# Patient Record
Sex: Female | Born: 1975 | Race: White | Hispanic: No | Marital: Married | Smoking: Never smoker
Health system: Southern US, Community
[De-identification: ages and names within clinical notes are randomized; demographics above are authoritative.]

## PROBLEM LIST (undated history)

## (undated) DIAGNOSIS — N979 Female infertility, unspecified: Secondary | ICD-10-CM

## (undated) DIAGNOSIS — B019 Varicella without complication: Secondary | ICD-10-CM

## (undated) DIAGNOSIS — D259 Leiomyoma of uterus, unspecified: Secondary | ICD-10-CM

## (undated) DIAGNOSIS — N200 Calculus of kidney: Secondary | ICD-10-CM

## (undated) DIAGNOSIS — E282 Polycystic ovarian syndrome: Secondary | ICD-10-CM

## (undated) DIAGNOSIS — F4323 Adjustment disorder with mixed anxiety and depressed mood: Secondary | ICD-10-CM

## (undated) DIAGNOSIS — Z87442 Personal history of urinary calculi: Secondary | ICD-10-CM

## (undated) DIAGNOSIS — N939 Abnormal uterine and vaginal bleeding, unspecified: Secondary | ICD-10-CM

## (undated) HISTORY — DX: Calculus of kidney: N20.0

## (undated) HISTORY — DX: Adjustment disorder with mixed anxiety and depressed mood: F43.23

## (undated) HISTORY — DX: Female infertility, unspecified: N97.9

## (undated) HISTORY — PX: KNEE ARTHROSCOPY W/ ACL RECONSTRUCTION: SHX1858

## (undated) HISTORY — DX: Varicella without complication: B01.9

## (undated) HISTORY — DX: Polycystic ovarian syndrome: E28.2

## (undated) HISTORY — PX: EXTRACORPOREAL SHOCK WAVE LITHOTRIPSY: SHX1557

---

## 2004-10-02 ENCOUNTER — Ambulatory Visit: Payer: Self-pay | Admitting: Family Medicine

## 2004-10-11 ENCOUNTER — Ambulatory Visit: Payer: Self-pay | Admitting: Family Medicine

## 2005-01-04 ENCOUNTER — Ambulatory Visit: Payer: Self-pay | Admitting: Family Medicine

## 2005-11-13 ENCOUNTER — Ambulatory Visit: Payer: Self-pay | Admitting: Family Medicine

## 2007-08-03 ENCOUNTER — Encounter: Payer: Self-pay | Admitting: Family Medicine

## 2007-10-28 ENCOUNTER — Ambulatory Visit (HOSPITAL_COMMUNITY): Admission: RE | Admit: 2007-10-28 | Discharge: 2007-10-28 | Payer: Self-pay | Admitting: Obstetrics and Gynecology

## 2008-04-16 ENCOUNTER — Inpatient Hospital Stay (HOSPITAL_COMMUNITY): Admission: AD | Admit: 2008-04-16 | Discharge: 2008-04-16 | Payer: Self-pay | Admitting: Obstetrics and Gynecology

## 2008-08-08 HISTORY — PX: MYOMECTOMY: SHX85

## 2008-08-08 HISTORY — PX: MYOMECTOMY ABDOMINAL APPROACH: SUR870

## 2008-12-06 ENCOUNTER — Ambulatory Visit (HOSPITAL_COMMUNITY): Admission: RE | Admit: 2008-12-06 | Discharge: 2008-12-06 | Payer: Self-pay | Admitting: Gynecology

## 2009-03-30 ENCOUNTER — Ambulatory Visit: Payer: Self-pay | Admitting: Family Medicine

## 2009-03-30 DIAGNOSIS — D179 Benign lipomatous neoplasm, unspecified: Secondary | ICD-10-CM | POA: Insufficient documentation

## 2009-03-30 DIAGNOSIS — R143 Flatulence: Secondary | ICD-10-CM

## 2009-03-30 DIAGNOSIS — R142 Eructation: Secondary | ICD-10-CM

## 2009-03-30 DIAGNOSIS — R141 Gas pain: Secondary | ICD-10-CM

## 2009-03-30 DIAGNOSIS — N390 Urinary tract infection, site not specified: Secondary | ICD-10-CM

## 2009-03-30 DIAGNOSIS — Z87442 Personal history of urinary calculi: Secondary | ICD-10-CM

## 2009-03-30 LAB — CONVERTED CEMR LAB
Bilirubin Urine: NEGATIVE
Blood in Urine, dipstick: NEGATIVE
Ketones, urine, test strip: NEGATIVE
Protein, U semiquant: NEGATIVE
Urobilinogen, UA: 0.2

## 2009-04-03 LAB — CONVERTED CEMR LAB
AST: 18 units/L (ref 0–37)
Albumin: 4.4 g/dL (ref 3.5–5.2)
Basophils Absolute: 0 10*3/uL (ref 0.0–0.1)
Basophils Relative: 0.3 % (ref 0.0–3.0)
CO2: 28 meq/L (ref 19–32)
GFR calc non Af Amer: 87.24 mL/min (ref >60)
Glucose, Bld: 87 mg/dL (ref 70–99)
HCT: 38.4 % (ref 36.0–46.0)
HDL: 87.7 mg/dL (ref >39.00)
Hemoglobin: 13.3 g/dL (ref 12.0–15.0)
Lymphs Abs: 2 10*3/uL (ref 0.7–4.0)
MCHC: 34.6 g/dL (ref 30.0–36.0)
Monocytes Relative: 8.1 % (ref 3.0–12.0)
Neutro Abs: 1.7 10*3/uL (ref 1.4–7.7)
Potassium: 4.8 meq/L (ref 3.5–5.1)
RBC: 3.94 M/uL (ref 3.87–5.11)
RDW: 11.9 % (ref 11.5–14.6)
Sodium: 144 meq/L (ref 135–145)
TSH: 1.06 microintl units/mL (ref 0.35–5.50)
Total CHOL/HDL Ratio: 2
Total Protein: 6.4 g/dL (ref 6.0–8.3)

## 2010-01-11 ENCOUNTER — Inpatient Hospital Stay (HOSPITAL_COMMUNITY): Admission: AD | Admit: 2010-01-11 | Discharge: 2010-01-11 | Payer: Self-pay | Admitting: Obstetrics and Gynecology

## 2010-01-11 ENCOUNTER — Ambulatory Visit: Payer: Self-pay | Admitting: Obstetrics and Gynecology

## 2010-04-10 ENCOUNTER — Inpatient Hospital Stay (HOSPITAL_COMMUNITY): Admission: AD | Admit: 2010-04-10 | Discharge: 2010-04-10 | Payer: Self-pay | Admitting: Obstetrics and Gynecology

## 2010-04-10 DIAGNOSIS — O4700 False labor before 37 completed weeks of gestation, unspecified trimester: Secondary | ICD-10-CM

## 2010-04-10 DIAGNOSIS — O368390 Maternal care for abnormalities of the fetal heart rate or rhythm, unspecified trimester, not applicable or unspecified: Secondary | ICD-10-CM

## 2010-04-13 ENCOUNTER — Other Ambulatory Visit: Payer: Self-pay | Admitting: Obstetrics and Gynecology

## 2010-04-16 ENCOUNTER — Inpatient Hospital Stay (HOSPITAL_COMMUNITY): Admission: AD | Admit: 2010-04-16 | Discharge: 2010-04-20 | Payer: Self-pay | Admitting: Obstetrics and Gynecology

## 2010-04-16 ENCOUNTER — Encounter (INDEPENDENT_AMBULATORY_CARE_PROVIDER_SITE_OTHER): Payer: Self-pay | Admitting: Obstetrics and Gynecology

## 2010-05-24 ENCOUNTER — Ambulatory Visit
Admission: RE | Admit: 2010-05-24 | Discharge: 2010-05-24 | Payer: Self-pay | Source: Home / Self Care | Attending: Obstetrics and Gynecology | Admitting: Obstetrics and Gynecology

## 2010-05-30 ENCOUNTER — Ambulatory Visit
Admission: RE | Admit: 2010-05-30 | Discharge: 2010-05-30 | Payer: Self-pay | Source: Home / Self Care | Attending: Obstetrics and Gynecology | Admitting: Obstetrics and Gynecology

## 2010-08-21 LAB — SURGICAL PCR SCREEN
MRSA, PCR: NEGATIVE
Staphylococcus aureus: NEGATIVE

## 2010-08-21 LAB — RH IMMUNE GLOB WKUP(>/=20WKS)(NOT WOMEN'S HOSP): Unit division: 0

## 2010-08-21 LAB — CBC
HCT: 37.2 % (ref 36.0–46.0)
Hemoglobin: 10.6 g/dL — ABNORMAL LOW (ref 12.0–15.0)
Hemoglobin: 12.9 g/dL (ref 12.0–15.0)
Hemoglobin: 13.3 g/dL (ref 12.0–15.0)
MCH: 33.9 pg (ref 26.0–34.0)
MCH: 34.5 pg — ABNORMAL HIGH (ref 26.0–34.0)
MCHC: 34.7 g/dL (ref 30.0–36.0)
MCV: 99.6 fL (ref 78.0–100.0)
Platelets: 166 10*3/uL (ref 150–400)
Platelets: 217 10*3/uL (ref 150–400)
RBC: 3.08 MIL/uL — ABNORMAL LOW (ref 3.87–5.11)
RBC: 3.78 MIL/uL — ABNORMAL LOW (ref 3.87–5.11)
RBC: 3.93 MIL/uL (ref 3.87–5.11)
WBC: 13.1 10*3/uL — ABNORMAL HIGH (ref 4.0–10.5)
WBC: 15 10*3/uL — ABNORMAL HIGH (ref 4.0–10.5)

## 2010-08-21 LAB — TYPE AND SCREEN
ABO/RH(D): A NEG
Antibody Screen: POSITIVE
Weak D: NEGATIVE

## 2010-08-21 LAB — RPR
RPR Ser Ql: NONREACTIVE
RPR Ser Ql: NONREACTIVE

## 2010-08-24 LAB — URINALYSIS, ROUTINE W REFLEX MICROSCOPIC
Glucose, UA: NEGATIVE mg/dL
Ketones, ur: NEGATIVE mg/dL
Nitrite: NEGATIVE
Protein, ur: NEGATIVE mg/dL

## 2011-04-18 ENCOUNTER — Ambulatory Visit: Payer: Self-pay | Admitting: Family Medicine

## 2011-04-23 ENCOUNTER — Encounter: Payer: Self-pay | Admitting: Family Medicine

## 2011-04-23 ENCOUNTER — Ambulatory Visit (INDEPENDENT_AMBULATORY_CARE_PROVIDER_SITE_OTHER): Payer: Self-pay | Admitting: Family Medicine

## 2011-04-23 DIAGNOSIS — N390 Urinary tract infection, site not specified: Secondary | ICD-10-CM

## 2011-04-29 ENCOUNTER — Ambulatory Visit: Payer: Self-pay | Admitting: Family Medicine

## 2011-05-16 NOTE — Progress Notes (Signed)
  Subjective:    Patient ID: Christina Olson, female    DOB: [birth year], 35 y.o.   MRN: [id number]  HPI    Review of Systems     Objective:   Physical Exam        Assessment & Plan:  She cancelled this appt

## 2011-12-09 ENCOUNTER — Other Ambulatory Visit: Payer: Self-pay | Admitting: Urology

## 2011-12-11 ENCOUNTER — Encounter (HOSPITAL_COMMUNITY): Payer: Self-pay | Admitting: *Deleted

## 2011-12-11 NOTE — Progress Notes (Signed)
Patient instructed no Aspirin products 3 days prior to ESWL 

## 2011-12-16 ENCOUNTER — Encounter (HOSPITAL_COMMUNITY): Payer: Self-pay | Admitting: Pharmacy Technician

## 2011-12-16 ENCOUNTER — Ambulatory Visit (HOSPITAL_COMMUNITY): Payer: Self-pay

## 2011-12-16 ENCOUNTER — Encounter (HOSPITAL_COMMUNITY)
Admission: RE | Disposition: A | Discharge status: Home / Self Care | Payer: Self-pay | Source: Ambulatory Visit | Attending: Urology

## 2011-12-16 ENCOUNTER — Encounter (HOSPITAL_COMMUNITY): Payer: Self-pay | Admitting: *Deleted

## 2011-12-16 ENCOUNTER — Ambulatory Visit (HOSPITAL_COMMUNITY)
Admission: RE | Admit: 2011-12-16 | Discharge: 2011-12-16 | Disposition: A | Discharge status: Home / Self Care | Payer: Self-pay | Source: Ambulatory Visit | Attending: Urology | Admitting: Urology

## 2011-12-16 DIAGNOSIS — N201 Calculus of ureter: Secondary | ICD-10-CM | POA: Insufficient documentation

## 2011-12-16 HISTORY — PX: LITHOTRIPSY: SUR834

## 2011-12-16 SURGERY — LITHOTRIPSY, ESWL
Anesthesia: LOCAL | Laterality: Right

## 2011-12-16 MED ORDER — CIPROFLOXACIN HCL 500 MG PO TABS
500.0000 mg | ORAL_TABLET | ORAL | Status: AC
  Administered 2011-12-16: 500 mg via ORAL

## 2011-12-16 MED ORDER — DIPHENHYDRAMINE HCL 25 MG PO CAPS
25.0000 mg | ORAL_CAPSULE | ORAL | Status: AC
  Administered 2011-12-16: 25 mg via ORAL

## 2011-12-16 MED ORDER — DIPHENHYDRAMINE HCL 25 MG PO CAPS
ORAL_CAPSULE | ORAL | Status: AC
  Filled 2011-12-16: qty 1

## 2011-12-16 MED ORDER — DEXTROSE-NACL 5-0.45 % IV SOLN
INTRAVENOUS | Status: DC
  Administered 2011-12-16: 1000 mL via INTRAVENOUS

## 2011-12-16 MED ORDER — DIAZEPAM 5 MG PO TABS
10.0000 mg | ORAL_TABLET | ORAL | Status: AC
  Administered 2011-12-16: 10 mg via ORAL

## 2011-12-16 MED ORDER — CIPROFLOXACIN HCL 500 MG PO TABS
ORAL_TABLET | ORAL | Status: AC
  Filled 2011-12-16: qty 1

## 2011-12-16 MED ORDER — DIAZEPAM 5 MG PO TABS
ORAL_TABLET | ORAL | Status: AC
  Filled 2011-12-16: qty 2

## 2011-12-16 NOTE — H&P (Signed)
History of Present Illness   [name] came in today for a KUB. I compared it to her CT scan and in my opinion the stone has dropped from the level to the iliac to the distal right ureter. I reviewed the X-rays with Dr. Annabell Howells and he agreed with me. The stone is approximately 7 mm in size and somewhat irregular.   She has been pain free.   She has mild stable frequency.   Her abdominal pain has settled down.   Review of systems: No change in bowel or neurologic status.   Urinalysis: Positive red blood cells. Rare bacteria. Urine was sent for culture.    Surgical History Problems  1. History of  Cesarean Section 2. History of  Uterine Surgery  Current Meds 1. Hydrocodone-Acetaminophen 5-325 MG Oral Tablet; TAKE 1 TABLET EVERY 4 TO 6 HOURS AS  NEEDED; Therapy: 20Jun2013 to (Evaluate:27Jun2013); Last Rx:20Jun2013 2. Mirena IUD; Therapy: (Recorded:19Jun2013) to 3. Tretinoin 0.05 % External Cream; Therapy: 11Sep2012 to  Allergies Medication  1. No Known Drug Allergies  Family History Problems  1. Family history of  Death In The Family Father dead at age 8 of heart attack 2. Family history of  Family Health Status Number Of Children twin boys  Social History Problems  1. Alcohol Use rare wine 2. Caffeine Use 1 cup of coffee daily 3. Marital History - Currently Married 4. Never A Smoker 5. Occupation: Stay at home Mom  Results/Data  Urine Tempie Donning Includes: Last 1 Day]   01Jul2013  COLOR YELLOW   APPEARANCE CLEAR   SPECIFIC GRAVITY 1.020   pH 6.5   GLUCOSE NEG mg/dL  BILIRUBIN NEG   KETONE NEG mg/dL  BLOOD MOD   PROTEIN TRACE mg/dL  UROBILINOGEN 0.2 mg/dL  NITRITE NEG   LEUKOCYTE ESTERASE NEG   SQUAMOUS EPITHELIAL/HPF NONE SEEN   WBC 3-6 WBC/hpf  RBC 7-10 RBC/hpf  BACTERIA RARE   CRYSTALS NONE SEEN   CASTS NONE SEEN    Assessment Assessed  1. Abdominal Pain 789.00 2. Urinary Calculus Bilaterally 592.9  Plan   Discussion/Summary   I talked to Ms  Upham about passing the stone. She was given a strainer and will push fluids. She was given two weeks of Rapaflo samples. She has plenty of pain medicine. I gave her a prescription of Phenergan 20 tablets as needed.  I talked to her about lithotripsy.  We talked about ESWL in detail. Pros, cons, general surgical and anesthetic risks, and other options including watchful waiting and ureteroscopy were discussed. Success and failure rates and need for further/repeat therapy were discussed. Risks were described but not limited to pain, infection, sepsis, and bleeding. The risk of renal and ureteral trauma with short and long term sequelae was discussed. The risk of injury to adjacent structures was discussed. The risk of needing a stent post-ESWL was discussed. Theoretical issues involving ovary were discussed.  I talked to her about ureteroscopy including success, failure rates and risks. Sequelae requiring reimplantation or percutaneous tube were also discussed.  I agree with our plan that she would like to have lithotripsy next Monday if she does not pass the stone. She will go to the [facility] Emergency Room or call if her pain is difficult to control or she develops a fever.   Ms. Dunsmore is obviously a stone former. She will need a metabolic workup moving forward.   After a thorough review of the management options for the patient's condition the patient  elected to  proceed with surgical therapy as noted above. We have discussed the potential benefits and risks of the procedure, side effects of the proposed treatment, the likelihood of the patient achieving the goals of the procedure, and any potential problems that might occur during the procedure or recuperation. Informed consent has been obtained.

## 2012-02-11 ENCOUNTER — Emergency Department (HOSPITAL_COMMUNITY)
Admission: EM | Admit: 2012-02-11 | Discharge: 2012-02-12 | Disposition: A | Discharge status: Home / Self Care | Payer: 59 | Attending: Emergency Medicine | Admitting: Emergency Medicine

## 2012-02-11 ENCOUNTER — Emergency Department (HOSPITAL_COMMUNITY): Payer: 59

## 2012-02-11 ENCOUNTER — Encounter (HOSPITAL_COMMUNITY): Payer: Self-pay | Admitting: *Deleted

## 2012-02-11 DIAGNOSIS — M654 Radial styloid tenosynovitis [de Quervain]: Secondary | ICD-10-CM

## 2012-02-11 DIAGNOSIS — M65839 Other synovitis and tenosynovitis, unspecified forearm: Secondary | ICD-10-CM | POA: Insufficient documentation

## 2012-02-11 NOTE — ED Notes (Signed)
Patient with wrist pain for about two months and today she felt a sharp pain on it and she fell onto the left wrist

## 2012-02-12 MED ORDER — HYDROCODONE-ACETAMINOPHEN 5-325 MG PO TABS: 1.0000 | ORAL_TABLET | Freq: Four times a day (QID) | ORAL | Status: AC | PRN

## 2012-02-12 MED ORDER — NAPROXEN 500 MG PO TABS: 500.0000 mg | ORAL_TABLET | Freq: Two times a day (BID) | ORAL | Status: AC

## 2012-02-12 NOTE — ED Provider Notes (Signed)
Medical screening examination/treatment/procedure(s) were performed by non-physician practitioner and as supervising physician I was immediately available for consultation/collaboration.  Royale Swamy, MD 02/12/12 2211 

## 2012-02-12 NOTE — ED Provider Notes (Signed)
History     CSN: [id number]  Arrival date & time 02/11/12  2120   First MD Initiated Contact with Patient 02/12/12 0036      Chief Complaint  Patient presents with  . Wrist Pain    (Consider location/radiation/quality/duration/timing/severity/associated sxs/prior treatment) HPI Comments: Patient is a 36 year old healthy female that presents emergency department with chief complaint of left wrist pain.  Patient states that pain began about 2 months ago, but has been occasional worsened by certain thumb or wrist positions.  Symptoms have been tolerable until earlier today when she fell holding her baby landing on her wrist.  Patient denies any decreased range of motion, numbness, or tingling of extremity.  Note the patient is a mother of 2 and is constantly holding both kids and picking them up.  No other complaints this time.  Patient is a 37 y.o. female presenting with wrist pain. The history is provided by the patient.  Wrist Pain Pertinent negatives include no abdominal pain, chest pain, chills, congestion, fever, headaches, numbness or weakness.    Past Medical History  Diagnosis Date  . Chicken pox   . Infertility, female     has seen Dr Chevis Pretty and Dr. Billy Coast, now sees Dr.Richard Clarkson in [place]  . Polycystic ovaries   . Situational mixed anxiety and depressive disorder     anxiety about flying  . Nephrolithiasis     hx of twice in 1998, none since    Past Surgical History  Procedure Date  . Myomectomy 3/10    7 fibroids    Family History  Problem Relation Age of Onset  . Heart attack Father 27  . Hypertension Other     History  Substance Use Topics  . Smoking status: Never Smoker   . Smokeless tobacco: Not on file  . Alcohol Use: Yes     rarely    OB History    Grav Para Term Preterm Abortions TAB SAB Ect Mult Living                  Review of Systems  Constitutional: Negative for fever, chills and appetite change.  HENT: Negative for congestion.    Eyes: Negative for visual disturbance.  Respiratory: Negative for shortness of breath.   Cardiovascular: Negative for chest pain and leg swelling.  Gastrointestinal: Negative for abdominal pain.  Genitourinary: Negative for dysuria, urgency and frequency.  Neurological: Negative for dizziness, syncope, weakness, light-headedness, numbness and headaches.  Psychiatric/Behavioral: Negative for confusion.  All other systems reviewed and are negative.    Allergies  Review of patient's allergies indicates no known allergies.  Home Medications   Current Outpatient Rx  Name Route Sig Dispense Refill  . LEVONORGESTREL 20 MCG/24HR IU IUD Intrauterine 1 each by Intrauterine route once.    . TRETINOIN 0.05 % EX CREA Topical Apply 1 application topically at bedtime. Apply to face      BP 122/76  Pulse 59  Temp 99.2 F (37.3 C) (Oral)  Resp 18  SpO2 100%  Physical Exam  Nursing note and vitals reviewed. Constitutional: She appears well-developed and well-nourished. No distress.  HENT:  Head: Normocephalic and atraumatic.  Eyes: Conjunctivae and EOM are normal.  Neck: Normal range of motion. Neck supple.  Cardiovascular:       Intact distal pulses, capillary refill < 3 seconds  Musculoskeletal:       Tenderness over palpation over the extensor pollicis breves tendon. Pain with thumb ROM. NO pain with wrist ROM  and no bony ttp including over the scaphoid. Finkelstein's test positive. All other extremities with normal ROM  Neurological:       No sensory deficit  Skin: She is not diaphoretic.       Skin intact, no tenting    ED Course  Procedures (including critical care time)  Labs Reviewed - No data to display Dg Wrist Complete Left  02/11/2012  *RADIOLOGY REPORT*  Clinical Data: Pain post trauma  LEFT WRIST - COMPLETE 3+ VIEW  Comparison: None.  Findings: Frontal, lateral, oblique, and ulnar deviation scaphoid images were obtained.  No fracture or dislocation.  Joint spaces  appear intact.  No erosive change.  IMPRESSION:  No fracture or dislocation.   Original Report Authenticated By: Arvin Collard. WOODRUFF III, M.D.      No diagnosis found.    MDM  Thumb tendonitis  Pt is a 23 yo healthy mother presents with thumb tendonitis. Will dc w NSAIDs home care instructions & Hand follow up id s/s persist         Jaci Carrel, PA-C 02/12/12 0121  Jaci Carrel, PA-C 02/12/12 0131

## 2012-02-12 NOTE — ED Notes (Signed)
Pt denies any questions upon discharge, verbalized understanding of no driving to pain meds and f/u instructions. Pt has splint applied to left thumb/wrist upon discharge.

## 2012-05-27 ENCOUNTER — Other Ambulatory Visit: Payer: Self-pay | Admitting: Dermatology

## 2012-10-26 ENCOUNTER — Telehealth: Payer: Self-pay | Admitting: Family Medicine

## 2012-10-26 NOTE — Telephone Encounter (Signed)
Patient Information:  Caller Name: [name]  Phone: [phone]  Patient: Christina Olson, Christina Olson  Gender: Female  DOB: 1976/01/06  Age: 37 Years  PCP: Gershon Crane [facility])  Pregnant: No  Office Follow Up:  Does the office need to follow up with this patient?: No  Instructions For The Office: N/A   Symptoms  Reason For Call & Symptoms: Pt states she had a tick bite and the bite site has not healed.  Reviewed Health History In EMR: Yes  Reviewed Medications In EMR: Yes  Reviewed Allergies In EMR: Yes  Reviewed Surgeries / Procedures: Yes  Date of Onset of Symptoms: 09/18/2012 OB / GYN:  LMP: 10/12/2012  Guideline(s) Used:  Tick Bite  Disposition Per Guideline:   Home Care  Reason For Disposition Reached:   Tick bite with no complications  Advice Given:  Tiny Deer Tick Removal:  Deer ticks are very small and need to be scraped off with a credit card edge or the edge of a knife blade.  Place tick in a sealed container (e.g., glass jar, Ziploc plastic bag), in case your doctor wants to see it.  Antibiotic Ointment:  Wash the wound and your hands with soap and water after removal to prevent catching any tick disease. Apply an over-the-counter antibiotic ointment (e.g., bacitracin) to the bite once.  Expected Course:  Tick bites normally do not itch or hurt. That is why they often go unnoticed.  Call Back If:  Bite begins to look infected  You become worse.  Patient Will Follow Care Advice:  YES

## 2013-08-18 IMAGING — CR DG WRIST COMPLETE 3+V*L*
4 series · 4 of 4 positions shown · non-contrast
Comparison: None.

CLINICAL DATA: Pain post trauma

LEFT WRIST - COMPLETE 3+ VIEW

[x wrist pa left]
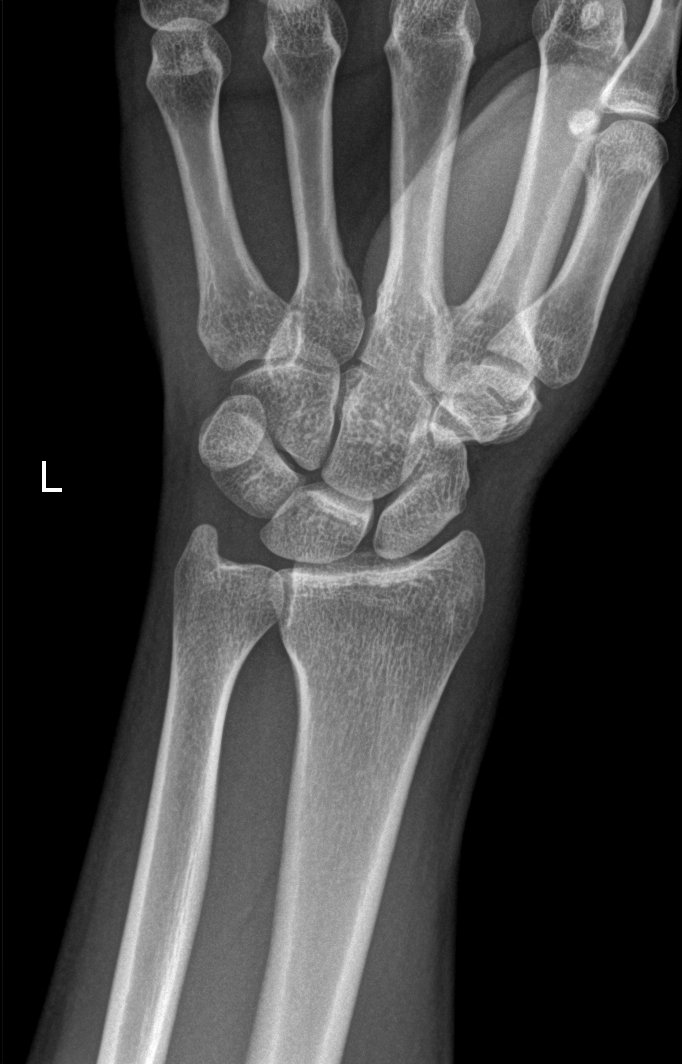

[x wrist obl left]
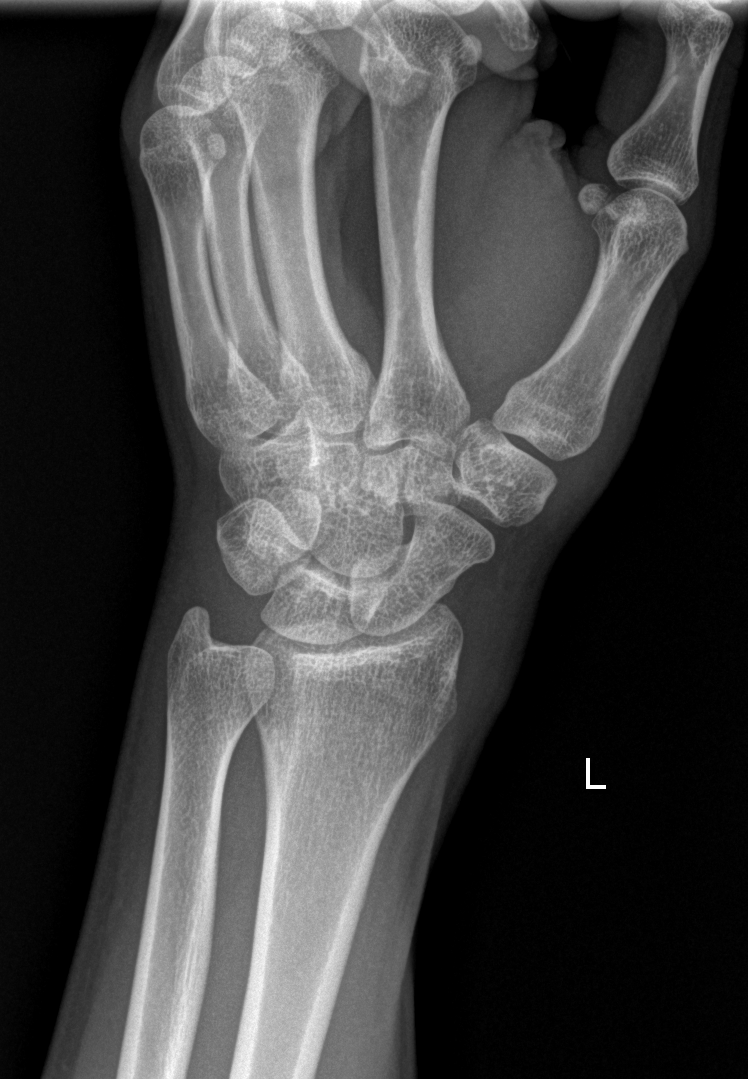

[x wrist lat left]
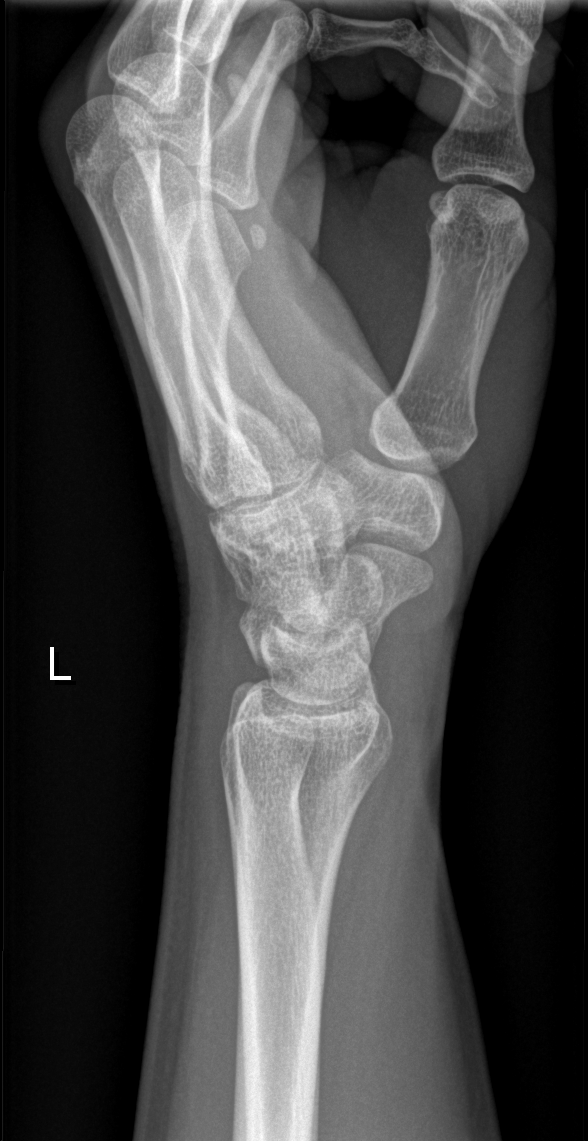

[x wrist navicular view left]
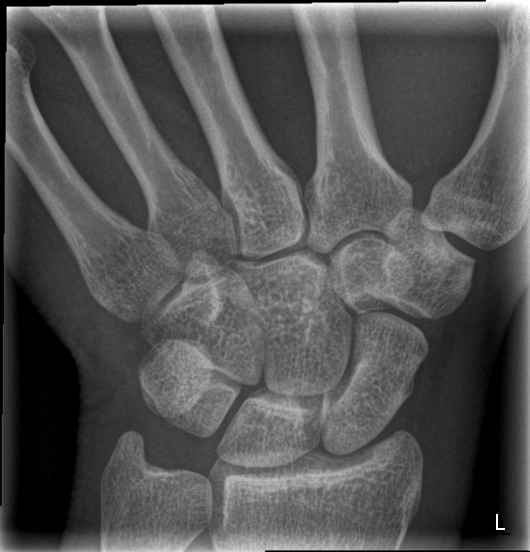

[4 of 4 positions shown; findings below may reference images not displayed]

FINDINGS: Frontal, lateral, oblique, and ulnar deviation scaphoid
images were obtained.  No fracture or dislocation.  Joint spaces
appear intact.  No erosive change.
IMPRESSION: No fracture or dislocation.

## 2013-11-12 ENCOUNTER — Encounter: Payer: Self-pay | Admitting: Family Medicine

## 2013-11-12 ENCOUNTER — Ambulatory Visit (INDEPENDENT_AMBULATORY_CARE_PROVIDER_SITE_OTHER): Payer: Commercial Managed Care - PPO | Admitting: Family Medicine

## 2013-11-12 VITALS — BP 133/77 | HR 45 | Temp 99.4°F | Ht 63.0 in | Wt 124.0 lb

## 2013-11-12 DIAGNOSIS — D171 Benign lipomatous neoplasm of skin and subcutaneous tissue of trunk: Secondary | ICD-10-CM

## 2013-11-12 DIAGNOSIS — D1779 Benign lipomatous neoplasm of other sites: Secondary | ICD-10-CM

## 2013-11-12 DIAGNOSIS — R599 Enlarged lymph nodes, unspecified: Secondary | ICD-10-CM

## 2013-11-12 DIAGNOSIS — H6120 Impacted cerumen, unspecified ear: Secondary | ICD-10-CM

## 2013-11-12 DIAGNOSIS — R590 Localized enlarged lymph nodes: Secondary | ICD-10-CM

## 2013-11-12 MED ORDER — ALPRAZOLAM 0.5 MG PO TABS: 0.5000 mg | ORAL_TABLET | Freq: Three times a day (TID) | ORAL | Status: DC | PRN

## 2013-11-12 NOTE — Progress Notes (Signed)
Pre visit review using our clinic review tool, if applicable. No additional management support is needed unless otherwise documented below in the visit note. 

## 2013-11-12 NOTE — Progress Notes (Signed)
   Subjective:    Patient ID: Christina Olson, female    DOB: 14-Oct-1975, 38 y.o.   MRN: [id number]  HPI Here for several things. First she asks me to check a lump over the right shoulder blade that has been present for years and does not bother her. Also she noticed a small lump in the right groin about 2 months ago. It does not bother her and does not seem to be changing. Last she says she has had decreased hearing in the right ear for 2 months. No pain or sinus pressure.    Review of Systems  Constitutional: Negative.   HENT: Positive for hearing loss. Negative for congestion, ear discharge, ear pain, postnasal drip and sinus pressure.   Hematological: Positive for adenopathy.       Objective:   Physical Exam  Constitutional: She appears well-developed and well-nourished. No distress.  HENT:  Left Ear: External ear normal.  Nose: Nose normal.  Mouth/Throat: Oropharynx is clear and moist.  The right ear canal is impacted with cerumen  Eyes: Conjunctivae are normal.  Neck: No thyromegaly present.  Abdominal:  There is a small (0.5 cm) firm mobile non-tender lump along the right inguinal ligament  Lymphadenopathy:    She has no cervical adenopathy.  Skin:  There is a 3 cm firm mobile non-tender mass over the right scapula          Assessment & Plan:  She has a lipoma on the back and a tiny lymph node in the right groin. These are benign and no treatment is required. We are unable to clear the cerumen impaction today so we will refer her to ENT.

## 2014-06-27 ENCOUNTER — Emergency Department (HOSPITAL_COMMUNITY)
Admission: EM | Admit: 2014-06-27 | Discharge: 2014-06-27 | Disposition: A | Discharge status: Home / Self Care | Payer: Commercial Managed Care - PPO | Attending: Emergency Medicine | Admitting: Emergency Medicine

## 2014-06-27 ENCOUNTER — Emergency Department (HOSPITAL_COMMUNITY): Payer: Commercial Managed Care - PPO

## 2014-06-27 ENCOUNTER — Encounter (HOSPITAL_COMMUNITY): Payer: Self-pay | Admitting: *Deleted

## 2014-06-27 DIAGNOSIS — F4323 Adjustment disorder with mixed anxiety and depressed mood: Secondary | ICD-10-CM | POA: Insufficient documentation

## 2014-06-27 DIAGNOSIS — Y9289 Other specified places as the place of occurrence of the external cause: Secondary | ICD-10-CM | POA: Insufficient documentation

## 2014-06-27 DIAGNOSIS — Z79899 Other long term (current) drug therapy: Secondary | ICD-10-CM | POA: Insufficient documentation

## 2014-06-27 DIAGNOSIS — W1839XA Other fall on same level, initial encounter: Secondary | ICD-10-CM | POA: Diagnosis not present

## 2014-06-27 DIAGNOSIS — Y998 Other external cause status: Secondary | ICD-10-CM | POA: Insufficient documentation

## 2014-06-27 DIAGNOSIS — S8992XA Unspecified injury of left lower leg, initial encounter: Secondary | ICD-10-CM | POA: Diagnosis not present

## 2014-06-27 DIAGNOSIS — W19XXXA Unspecified fall, initial encounter: Secondary | ICD-10-CM

## 2014-06-27 DIAGNOSIS — Z8619 Personal history of other infectious and parasitic diseases: Secondary | ICD-10-CM | POA: Diagnosis not present

## 2014-06-27 DIAGNOSIS — Y9344 Activity, trampolining: Secondary | ICD-10-CM | POA: Diagnosis not present

## 2014-06-27 DIAGNOSIS — Z87442 Personal history of urinary calculi: Secondary | ICD-10-CM | POA: Insufficient documentation

## 2014-06-27 MED ORDER — CYCLOBENZAPRINE HCL 10 MG PO TABS: 10.0000 mg | ORAL_TABLET | Freq: Two times a day (BID) | ORAL | Status: DC | PRN

## 2014-06-27 MED ORDER — HYDROCODONE-ACETAMINOPHEN 5-325 MG PO TABS
2.0000 | ORAL_TABLET | Freq: Once | ORAL | Status: AC
  Administered 2014-06-27: 2 via ORAL
  Filled 2014-06-27: qty 2

## 2014-06-27 MED ORDER — CYCLOBENZAPRINE HCL 10 MG PO TABS
10.0000 mg | ORAL_TABLET | Freq: Once | ORAL | Status: DC
  Filled 2014-06-27: qty 1

## 2014-06-27 MED ORDER — HYDROCODONE-ACETAMINOPHEN 5-325 MG PO TABS: 1.0000 | ORAL_TABLET | ORAL | Status: DC | PRN

## 2014-06-27 NOTE — ED Notes (Signed)
Pt states that she was at the trampoline park and a ball rolled under her and she fell. Pt reports left knee pain. Pt states that she has had injury to that knee in the past. Pulses present . No deformity noted.

## 2014-06-27 NOTE — Discharge Instructions (Signed)
Take Vicodin as needed for pain. Take Flexeril as needed for spasm. Follow up with your Orthopedic doctor for further evaluation. Rest, ice, and elevate.

## 2014-06-27 NOTE — ED Provider Notes (Signed)
CSN: [id number]     Arrival date & time 06/27/14  1203 History   First MD Initiated Contact with Patient 06/27/14 1314     Chief Complaint  Patient presents with  . Knee Pain     (Consider location/radiation/quality/duration/timing/severity/associated sxs/prior Treatment) HPI Comments: Patient is a 39 year old female who presents with left knee pain that started prior to arrival when she was jumping on a trampoline. Patient reports a ball rolled under her feet while she was jumping and she landed on it, causing her to fall. Patient reports immediate onset of throbbing and severe pain without radiation. Movement and weight bearing activity makes the pain worse. No alleviating factors. No other injury. No alleviating factors.    Past Medical History  Diagnosis Date  . Chicken pox   . Infertility, female     has seen Dr Carren Rang and Dr. Ronita Hipps, now sees Dr.Richard Mead[place] of [place] in [place]  . Polycystic ovaries   . Situational mixed anxiety and depressive disorder     anxiety about flying  . Nephrolithiasis     sees Dr. Bjorn Loser    Past Surgical History  Procedure Laterality Date  . Myomectomy  3/10    7 fibroids  . Lithotripsy  12-16-11    per Dr. Matilde Sprang    Family History  Problem Relation Age of Onset  . Heart attack Father 68  . Hypertension Other    History  Substance Use Topics  . Smoking status: Never Smoker   . Smokeless tobacco: Never Used  . Alcohol Use: Yes     Comment: occ   OB History    No data available     Review of Systems  Constitutional: Negative for fever, chills and fatigue.  HENT: Negative for trouble swallowing.   Eyes: Negative for visual disturbance.  Respiratory: Negative for shortness of breath.   Cardiovascular: Negative for chest pain and palpitations.  Gastrointestinal: Negative for nausea, vomiting, abdominal pain and diarrhea.  Genitourinary: Negative for dysuria and difficulty urinating.  Musculoskeletal: Positive for arthralgias.  Negative for neck pain.  Skin: Negative for color change.  Neurological: Negative for dizziness and weakness.  Psychiatric/Behavioral: Negative for dysphoric mood.      Allergies  Review of patient's allergies indicates no known allergies.  Home Medications   Prior to Admission medications   Medication Sig Start Date End Date Taking? Authorizing Provider  ALPRAZolam Duanne Moron) 0.5 MG tablet Take 1 tablet (0.5 mg total) by mouth 3 (three) times daily as needed for anxiety. 11/12/13   Laurey Morale, MD  levonorgestrel (MIRENA) 20 MCG/24HR IUD 1 each by Intrauterine route once.    Historical Provider, MD  tretinoin (RETIN-A) 0.05 % cream Apply 1 application topically at bedtime. Apply to face    Historical Provider, MD   There were no vitals taken for this visit. Physical Exam  Constitutional: She is oriented to person, place, and time. She appears well-developed and well-nourished. No distress.  HENT:  Head: Normocephalic and atraumatic.  Eyes: Conjunctivae and EOM are normal.  Neck: Normal range of motion.  Cardiovascular: Normal rate and regular rhythm.  Exam reveals no gallop and no friction rub.   No murmur heard. Pulmonary/Chest: Effort normal and breath sounds normal. She has no wheezes. She has no rales. She exhibits no tenderness.  Abdominal: Soft. She exhibits no distension. There is no tenderness. There is no rebound.  Musculoskeletal:  Limited ROM of left knee. Anterior left knee tenderness to palpation over patella tendon. No  obvious deformity. Mild generalized edema.   Neurological: She is alert and oriented to person, place, and time. Coordination normal.  Speech is goal-oriented. Moves limbs without ataxia.   Skin: Skin is warm and dry.  Psychiatric: She has a normal mood and affect. Her behavior is normal.  Nursing note and vitals reviewed.   ED Course  Procedures (including critical care time) Labs Review Labs Reviewed - No data to display  Imaging Review Dg  Knee Complete 4 Views Left  06/27/2014   CLINICAL DATA:  Patient fell while jumping on trampoline. Pain and swelling anteriorly.  EXAM: LEFT KNEE - COMPLETE 4+ VIEW  COMPARISON:  Left knee MRI April 15, 2009  FINDINGS: Frontal, lateral, and bilateral oblique views were obtained. There is no fracture or dislocation. No effusion. Joint spaces appear intact. No erosive change.  IMPRESSION: No fracture or dislocation.  No appreciable arthropathy.   Electronically Signed   By: Lowella Grip M.D.   On: 06/27/2014 16:60   SPLINT APPLICATION Date/Time: 6:00 PM Authorized by: Alvina Chou Consent: Verbal consent obtained. Risks and benefits: risks, benefits and alternatives were discussed Consent given by: patient Splint applied by: orthopedic technician Location details: left knee Splint type: knee immobilizer Supplies used: knee immobilizer Post-procedure: The splinted body part was neurovascularly unchanged following the procedure. Patient tolerance: Patient tolerated the procedure well with no immediate complications.      EKG Interpretation None      MDM   Final diagnoses:  Left knee injury, initial encounter    1:44 PM Xray unremarkable for acute changes. No other injury. Patient has no fracture but ligament or tendon injury cannot be excluded. Patient will have knee immobilizer and crutches. Patient will follow up with Orthopedic surgery. No neurovascular compromise.     Alvina Chou, PA-C 06/30/14 1551  Johnna Acosta, MD 07/01/14 [phone]

## 2015-06-07 ENCOUNTER — Ambulatory Visit (INDEPENDENT_AMBULATORY_CARE_PROVIDER_SITE_OTHER): Payer: Commercial Managed Care - PPO | Admitting: Family Medicine

## 2015-06-07 ENCOUNTER — Encounter: Payer: Self-pay | Admitting: Family Medicine

## 2015-06-07 VITALS — BP 126/84 | HR 49 | Ht 63.0 in | Wt 122.0 lb

## 2015-06-07 DIAGNOSIS — S8992XA Unspecified injury of left lower leg, initial encounter: Secondary | ICD-10-CM | POA: Diagnosis not present

## 2015-06-08 DIAGNOSIS — S8992XA Unspecified injury of left lower leg, initial encounter: Secondary | ICD-10-CM | POA: Insufficient documentation

## 2015-06-08 NOTE — Progress Notes (Signed)
PCP: Laurey Morale, MD  Subjective:   HPI: Patient is a 39 y.o. female here for left knee injury.  Patient reports back in January she was jumping on a trampoline when a ball rolled under her feet causing her to fall and injure her left knee somehow. Immediate pain and throbbing in this knee, difficulty bearing weight. She was found to have patellar contusion and small fracture, ACL tear. Had initial period of rest then ACL reconstruction, medial meniscus repair followed by rehabilitation. Her rehab stalled at about 105 to 110 degrees flexion - had manipulation under anesthesia and cortisone injection and did well for a short period. Started to get pain, catching and stiffness within the left knee though. Went back to ortho - had MRI this month showing about 50% of ACL was intact, other part was potentially causing catching of the knee.  Also with a complex medial meniscus tear. She does not recall another injury this year. Plan is to have surgery again early next year - do a pivot test under anesthesia to assess ACL integrity. She is here for second opinion.  Past Medical History  Diagnosis Date  . Chicken pox   . Infertility, female     has seen Dr Carren Rang and Dr. Ronita Hipps, now sees Dr.Richard [place] in [place]  . Polycystic ovaries   . Situational mixed anxiety and depressive disorder     anxiety about flying  . Nephrolithiasis     sees Dr. Bjorn Loser     Current Outpatient Prescriptions on File Prior to Visit  Medication Sig Dispense Refill  . ALPRAZolam (XANAX) 0.5 MG tablet Take 1 tablet (0.5 mg total) by mouth 3 (three) times daily as needed for anxiety. 60 tablet 0  . cyclobenzaprine (FLEXERIL) 10 MG tablet Take 1 tablet (10 mg total) by mouth 2 (two) times daily as needed for muscle spasms. 20 tablet 0  . HYDROcodone-acetaminophen (NORCO/VICODIN) 5-325 MG per tablet Take 1-2 tablets by mouth every 4 (four) hours as needed for moderate pain or severe pain. 20 tablet 0   . levonorgestrel (MIRENA) 20 MCG/24HR IUD 1 each by Intrauterine route once.    . tretinoin (RETIN-A) 0.05 % cream Apply 1 application topically at bedtime. Apply to face     No current facility-administered medications on file prior to visit.    Past Surgical History  Procedure Laterality Date  . Myomectomy  3/10    7 fibroids  . Lithotripsy  12-16-11    per Dr. Matilde Sprang     No Known Allergies  Social History   Social History  . Marital Status: Married    Spouse Name: N/A  . Number of Children: N/A  . Years of Education: N/A   Occupational History  . Not on file.   Social History Main Topics  . Smoking status: Never Smoker   . Smokeless tobacco: Never Used  . Alcohol Use: 0.0 oz/week    0 Standard drinks or equivalent per week     Comment: occ  . Drug Use: No  . Sexual Activity: Yes    Birth Control/ Protection: IUD   Other Topics Concern  . Not on file   Social History Narrative    Family History  Problem Relation Age of Onset  . Heart attack Father 64  . Hypertension Other     BP 126/84 mmHg  Pulse 49  Ht 5' 3"  (1.6 m)  Wt 122 lb (55.339 kg)  BMI 21.62 kg/m2  Review of Systems: See HPI  above.    Objective:  Physical Exam:  Gen: NAD  Left knee: Mild effusion.  No bruising, other deformity. TTP medial joint line. FROM. 2+ ant drawer.  Negative posterior drawer. Negative valgus/varus testing. Negative lachmanns. Positive mcmurrays, apleys.  Negative patellar apprehension. NV intact distally.  Right knee: FROM without pain.    Assessment & Plan:  1. Left knee injury - While MRI is 50% intact on MRI clinically this does not appear to be functional.  She is getting catching and popping, stiffness, very symptomatic.  I advised I do not think she will improve without surgical intervention - either trial of arthroscopic debridement of truncated ACL with partial medial meniscectomy or repeat ACL reconstruction with partial meniscectomy.  Advised I  agree with Dr. Theda Sers' plan to go ahead with exam under anesthesia, assess via pivot test.  She plans to move forward with his care and surgical intervention.  Advised her to call us with any questions or concerns.  Total visit time 30 minutes of which half was spent on counseling, answering questions.

## 2015-06-08 NOTE — Assessment & Plan Note (Signed)
While MRI is 50% intact on MRI clinically this does not appear to be functional.  She is getting catching and popping, stiffness, very symptomatic.  I advised I do not think she will improve without surgical intervention - either trial of arthroscopic debridement of truncated ACL with partial medial meniscectomy or repeat ACL reconstruction with partial meniscectomy.  Advised I agree with Dr. Theda Sers' plan to go ahead with exam under anesthesia, assess via pivot test.  She plans to move forward with his care and surgical intervention.  Advised her to call us with any questions or concerns.  Total visit time 30 minutes of which half was spent on counseling, answering questions.

## 2019-06-15 DIAGNOSIS — Z20828 Contact with and (suspected) exposure to other viral communicable diseases: Secondary | ICD-10-CM | POA: Diagnosis not present

## 2019-07-14 DIAGNOSIS — D171 Benign lipomatous neoplasm of skin and subcutaneous tissue of trunk: Secondary | ICD-10-CM | POA: Diagnosis not present

## 2019-07-14 DIAGNOSIS — D2261 Melanocytic nevi of right upper limb, including shoulder: Secondary | ICD-10-CM | POA: Diagnosis not present

## 2019-07-14 DIAGNOSIS — D2271 Melanocytic nevi of right lower limb, including hip: Secondary | ICD-10-CM | POA: Diagnosis not present

## 2019-07-14 DIAGNOSIS — D224 Melanocytic nevi of scalp and neck: Secondary | ICD-10-CM | POA: Diagnosis not present

## 2019-10-12 DIAGNOSIS — Z01419 Encounter for gynecological examination (general) (routine) without abnormal findings: Secondary | ICD-10-CM | POA: Diagnosis not present

## 2019-10-12 DIAGNOSIS — Z1231 Encounter for screening mammogram for malignant neoplasm of breast: Secondary | ICD-10-CM | POA: Diagnosis not present

## 2019-10-12 DIAGNOSIS — Z6822 Body mass index (BMI) 22.0-22.9, adult: Secondary | ICD-10-CM | POA: Diagnosis not present

## 2019-10-12 LAB — HM MAMMOGRAPHY

## 2019-10-14 ENCOUNTER — Encounter: Payer: Self-pay | Admitting: Family Medicine

## 2019-11-03 DIAGNOSIS — N939 Abnormal uterine and vaginal bleeding, unspecified: Secondary | ICD-10-CM | POA: Diagnosis not present

## 2019-12-03 ENCOUNTER — Other Ambulatory Visit: Payer: Self-pay | Admitting: Obstetrics and Gynecology

## 2019-12-08 ENCOUNTER — Encounter (HOSPITAL_BASED_OUTPATIENT_CLINIC_OR_DEPARTMENT_OTHER): Payer: Self-pay | Admitting: Obstetrics and Gynecology

## 2019-12-09 NOTE — Progress Notes (Signed)
Left message with shanelle pinder for dr Ronita Hipps unable to make contact with patient to make covid appt or do medical history for 12-15-2019 surgery

## 2019-12-09 NOTE — Progress Notes (Addendum)
xxxx 

## 2019-12-15 ENCOUNTER — Ambulatory Visit (HOSPITAL_BASED_OUTPATIENT_CLINIC_OR_DEPARTMENT_OTHER)
Admission: RE | Admit: 2019-12-15 | Payer: BC Managed Care – PPO | Source: Home / Self Care | Admitting: Obstetrics and Gynecology

## 2019-12-15 ENCOUNTER — Encounter (HOSPITAL_BASED_OUTPATIENT_CLINIC_OR_DEPARTMENT_OTHER): Admission: RE | Payer: Self-pay | Source: Home / Self Care

## 2019-12-15 SURGERY — DILATATION & CURETTAGE/HYSTEROSCOPY WITH MYOSURE
Anesthesia: Choice

## 2020-03-08 ENCOUNTER — Telehealth (HOSPITAL_COMMUNITY): Payer: Self-pay | Admitting: Licensed Clinical Social Worker

## 2020-03-10 ENCOUNTER — Telehealth (HOSPITAL_COMMUNITY): Payer: Self-pay | Admitting: Licensed Clinical Social Worker

## 2020-03-10 NOTE — Telephone Encounter (Signed)
Client left return VM requesting call back about services. Clinician made 2 attempts at outreach via phone, 1 voicemail left. Will make follow up attempt Monday morning.

## 2020-03-13 ENCOUNTER — Telehealth (HOSPITAL_COMMUNITY): Payer: Self-pay | Admitting: Licensed Clinical Social Worker

## 2020-06-12 ENCOUNTER — Other Ambulatory Visit: Payer: Self-pay | Admitting: Obstetrics and Gynecology

## 2020-07-04 ENCOUNTER — Other Ambulatory Visit: Payer: Self-pay

## 2020-07-04 ENCOUNTER — Other Ambulatory Visit (HOSPITAL_COMMUNITY): Payer: BC Managed Care – PPO

## 2020-07-04 ENCOUNTER — Other Ambulatory Visit (HOSPITAL_COMMUNITY)
Admission: RE | Admit: 2020-07-04 | Discharge: 2020-07-04 | Disposition: A | Discharge status: Home / Self Care | Payer: BC Managed Care – PPO | Source: Ambulatory Visit | Attending: Obstetrics and Gynecology | Admitting: Obstetrics and Gynecology

## 2020-07-04 ENCOUNTER — Encounter (HOSPITAL_BASED_OUTPATIENT_CLINIC_OR_DEPARTMENT_OTHER): Payer: Self-pay | Admitting: Obstetrics and Gynecology

## 2020-07-04 DIAGNOSIS — Z20822 Contact with and (suspected) exposure to covid-19: Secondary | ICD-10-CM | POA: Insufficient documentation

## 2020-07-04 DIAGNOSIS — Z01812 Encounter for preprocedural laboratory examination: Secondary | ICD-10-CM | POA: Diagnosis not present

## 2020-07-04 NOTE — Progress Notes (Signed)
Spoke w/ via phone for pre-op interview--- PT Lab needs dos--  CBC, T&S, Urine preg           Lab results------ no COVID test ------ done 07-04-2020 Arrive at ------- 1200 on 07-07-2020 NPO after MN NO Solid Food.  Clear liquids from MN until--- 1100 Medications to take morning of surgery ----- NONE Diabetic medication ----- n/a Patient Special Instructions ----- n/a Pre-Op special Istructions ----- n/a Patient verbalized understanding of instructions that were given at this phone interview. Patient denies shortness of breath, chest pain, fever, cough at this phone interview.

## 2020-07-05 LAB — SARS CORONAVIRUS 2 (TAT 6-24 HRS): SARS Coronavirus 2: NEGATIVE

## 2020-07-14 NOTE — Progress Notes (Signed)
Spoke w/ via phone for pre-op interview---pt Lab needs dos----     Urine preg   Cbc T & S       Lab results------none COVID test ------07-15-2020 950 Arrive at -------1130 am 07-18-2020 NPO after MN NO Solid Food.  Clear liquids from MN until---1030 am then npo Medications to take morning of surgery -----none Diabetic medication -----n/a Patient Special Instructions -----none Pre-Op special Istructions -----none Patient verbalized understanding of instructions that were given at this phone interview. Patient denies shortness of breath, chest pain, fever, cough at this phone interview.

## 2020-07-15 ENCOUNTER — Other Ambulatory Visit (HOSPITAL_COMMUNITY)
Admission: RE | Admit: 2020-07-15 | Discharge: 2020-07-15 | Disposition: A | Discharge status: Home / Self Care | Payer: BC Managed Care – PPO | Source: Ambulatory Visit | Attending: Obstetrics and Gynecology | Admitting: Obstetrics and Gynecology

## 2020-07-15 DIAGNOSIS — N921 Excessive and frequent menstruation with irregular cycle: Secondary | ICD-10-CM | POA: Diagnosis present

## 2020-07-15 DIAGNOSIS — Z20822 Contact with and (suspected) exposure to covid-19: Secondary | ICD-10-CM | POA: Insufficient documentation

## 2020-07-15 DIAGNOSIS — Z01812 Encounter for preprocedural laboratory examination: Secondary | ICD-10-CM | POA: Insufficient documentation

## 2020-07-15 DIAGNOSIS — D250 Submucous leiomyoma of uterus: Secondary | ICD-10-CM | POA: Diagnosis not present

## 2020-07-15 DIAGNOSIS — Z30432 Encounter for removal of intrauterine contraceptive device: Secondary | ICD-10-CM | POA: Diagnosis not present

## 2020-07-15 LAB — SARS CORONAVIRUS 2 (TAT 6-24 HRS): SARS Coronavirus 2: NEGATIVE

## 2020-07-18 ENCOUNTER — Ambulatory Visit (HOSPITAL_BASED_OUTPATIENT_CLINIC_OR_DEPARTMENT_OTHER)
Admission: RE | Admit: 2020-07-18 | Discharge: 2020-07-18 | Disposition: A | Discharge status: Home / Self Care | Payer: BC Managed Care – PPO | Attending: Obstetrics and Gynecology | Admitting: Obstetrics and Gynecology

## 2020-07-18 ENCOUNTER — Ambulatory Visit (HOSPITAL_BASED_OUTPATIENT_CLINIC_OR_DEPARTMENT_OTHER): Payer: BC Managed Care – PPO | Admitting: Anesthesiology

## 2020-07-18 ENCOUNTER — Encounter (HOSPITAL_BASED_OUTPATIENT_CLINIC_OR_DEPARTMENT_OTHER)
Admission: RE | Disposition: A | Discharge status: Home / Self Care | Payer: Self-pay | Source: Home / Self Care | Attending: Obstetrics and Gynecology

## 2020-07-18 ENCOUNTER — Encounter (HOSPITAL_BASED_OUTPATIENT_CLINIC_OR_DEPARTMENT_OTHER): Payer: Self-pay | Admitting: Obstetrics and Gynecology

## 2020-07-18 DIAGNOSIS — N921 Excessive and frequent menstruation with irregular cycle: Secondary | ICD-10-CM | POA: Diagnosis not present

## 2020-07-18 DIAGNOSIS — D250 Submucous leiomyoma of uterus: Secondary | ICD-10-CM | POA: Insufficient documentation

## 2020-07-18 DIAGNOSIS — Z30432 Encounter for removal of intrauterine contraceptive device: Secondary | ICD-10-CM | POA: Insufficient documentation

## 2020-07-18 DIAGNOSIS — Z20822 Contact with and (suspected) exposure to covid-19: Secondary | ICD-10-CM | POA: Insufficient documentation

## 2020-07-18 HISTORY — PX: DILATATION & CURETTAGE/HYSTEROSCOPY WITH MYOSURE: SHX6511

## 2020-07-18 HISTORY — DX: Personal history of urinary calculi: Z87.442

## 2020-07-18 HISTORY — PX: HYSTEROSCOPY WITH NOVASURE: SHX5574

## 2020-07-18 HISTORY — DX: Leiomyoma of uterus, unspecified: D25.9

## 2020-07-18 HISTORY — DX: Abnormal uterine and vaginal bleeding, unspecified: N93.9

## 2020-07-18 LAB — POCT PREGNANCY, URINE: Preg Test, Ur: NEGATIVE

## 2020-07-18 LAB — CBC
HCT: 43.1 % (ref 36.0–46.0)
Hemoglobin: 14.6 g/dL (ref 12.0–15.0)
MCH: 32.8 pg (ref 26.0–34.0)
MCHC: 33.9 g/dL (ref 30.0–36.0)
MCV: 96.9 fL (ref 80.0–100.0)
Platelets: 238 10*3/uL (ref 150–400)
RBC: 4.45 MIL/uL (ref 3.87–5.11)
RDW: 11.9 % (ref 11.5–15.5)
WBC: 6.8 10*3/uL (ref 4.0–10.5)
nRBC: 0 % (ref 0.0–0.2)

## 2020-07-18 LAB — TYPE AND SCREEN
ABO/RH(D): A NEG
Antibody Screen: NEGATIVE

## 2020-07-18 SURGERY — DILATATION & CURETTAGE/HYSTEROSCOPY WITH MYOSURE
Anesthesia: General | Site: Uterus

## 2020-07-18 MED ORDER — PROPOFOL 10 MG/ML IV BOLUS
INTRAVENOUS | Status: AC
  Filled 2020-07-18: qty 20

## 2020-07-18 MED ORDER — MIDAZOLAM HCL 5 MG/5ML IJ SOLN
INTRAMUSCULAR | Status: DC | PRN
  Administered 2020-07-18: 2 mg via INTRAVENOUS

## 2020-07-18 MED ORDER — SODIUM CHLORIDE 0.9 % IR SOLN
Status: DC | PRN
  Administered 2020-07-18 (×2): 3000 mL

## 2020-07-18 MED ORDER — OXYCODONE HCL 5 MG PO TABS
5.0000 mg | ORAL_TABLET | Freq: Once | ORAL | Status: DC | PRN
Start: 2020-07-18 — End: 2020-07-18

## 2020-07-18 MED ORDER — FENTANYL CITRATE (PF) 100 MCG/2ML IJ SOLN
INTRAMUSCULAR | Status: DC | PRN
  Administered 2020-07-18: 25 ug via INTRAVENOUS
  Administered 2020-07-18: 50 ug via INTRAVENOUS

## 2020-07-18 MED ORDER — CEFAZOLIN SODIUM-DEXTROSE 2-4 GM/100ML-% IV SOLN
2.0000 g | INTRAVENOUS | Status: AC
  Administered 2020-07-18: 2 g via INTRAVENOUS

## 2020-07-18 MED ORDER — OXYCODONE HCL 5 MG/5ML PO SOLN
5.0000 mg | Freq: Once | ORAL | Status: DC | PRN
Start: 2020-07-18 — End: 2020-07-18

## 2020-07-18 MED ORDER — KETOROLAC TROMETHAMINE 30 MG/ML IJ SOLN
INTRAMUSCULAR | Status: DC | PRN
  Administered 2020-07-18: 30 mg via INTRAVENOUS

## 2020-07-18 MED ORDER — DEXAMETHASONE SODIUM PHOSPHATE 10 MG/ML IJ SOLN
INTRAMUSCULAR | Status: DC | PRN
  Administered 2020-07-18: 5 mg via INTRAVENOUS

## 2020-07-18 MED ORDER — BUPIVACAINE HCL (PF) 0.25 % IJ SOLN
INTRAMUSCULAR | Status: DC | PRN
  Administered 2020-07-18: 20 mL

## 2020-07-18 MED ORDER — FENTANYL CITRATE (PF) 100 MCG/2ML IJ SOLN
INTRAMUSCULAR | Status: AC
  Filled 2020-07-18: qty 2

## 2020-07-18 MED ORDER — POVIDONE-IODINE 10 % EX SWAB: 2.0000 "application " | Freq: Once | CUTANEOUS | Status: DC

## 2020-07-18 MED ORDER — FENTANYL CITRATE (PF) 100 MCG/2ML IJ SOLN: 25.0000 ug | INTRAMUSCULAR | Status: DC | PRN

## 2020-07-18 MED ORDER — CEFAZOLIN SODIUM-DEXTROSE 2-4 GM/100ML-% IV SOLN
INTRAVENOUS | Status: AC
  Filled 2020-07-18: qty 100

## 2020-07-18 MED ORDER — LACTATED RINGERS IV SOLN: INTRAVENOUS | Status: DC

## 2020-07-18 MED ORDER — MIDAZOLAM HCL 2 MG/2ML IJ SOLN
INTRAMUSCULAR | Status: AC
  Filled 2020-07-18: qty 2

## 2020-07-18 MED ORDER — VASOPRESSIN 20 UNIT/ML IV SOLN
INTRAVENOUS | Status: DC | PRN
  Administered 2020-07-18: 20 mL via INTRAMUSCULAR

## 2020-07-18 MED ORDER — ONDANSETRON HCL 4 MG/2ML IJ SOLN: 4.0000 mg | Freq: Once | INTRAMUSCULAR | Status: DC | PRN

## 2020-07-18 MED ORDER — PROPOFOL 10 MG/ML IV BOLUS
INTRAVENOUS | Status: DC | PRN
  Administered 2020-07-18: 180 mg via INTRAVENOUS

## 2020-07-18 MED ORDER — DEXMEDETOMIDINE (PRECEDEX) IN NS 20 MCG/5ML (4 MCG/ML) IV SYRINGE
PREFILLED_SYRINGE | INTRAVENOUS | Status: DC | PRN
  Administered 2020-07-18: 8 ug via INTRAVENOUS

## 2020-07-18 MED ORDER — LIDOCAINE HCL (PF) 2 % IJ SOLN
INTRAMUSCULAR | Status: AC
  Filled 2020-07-18: qty 5

## 2020-07-18 MED ORDER — TRAMADOL HCL 50 MG PO TABS: 50.0000 mg | ORAL_TABLET | Freq: Four times a day (QID) | ORAL | 0 refills | Status: AC | PRN

## 2020-07-18 MED ORDER — LIDOCAINE 2% (20 MG/ML) 5 ML SYRINGE
INTRAMUSCULAR | Status: DC | PRN
  Administered 2020-07-18: 50 mg via INTRAVENOUS

## 2020-07-18 MED ORDER — ONDANSETRON HCL 4 MG/2ML IJ SOLN
INTRAMUSCULAR | Status: DC | PRN
  Administered 2020-07-18 (×2): 4 mg via INTRAVENOUS

## 2020-07-18 SURGICAL SUPPLY — 19 items
ABLATOR SURESOUND NOVASURE (ABLATOR) ×4 IMPLANT
CATH ROBINSON RED A/P 16FR (CATHETERS) ×2 IMPLANT
DECANTER SPIKE VIAL GLASS SM (MISCELLANEOUS) ×2 IMPLANT
DEVICE MYOSURE LITE (MISCELLANEOUS) IMPLANT
DEVICE MYOSURE REACH (MISCELLANEOUS) ×2 IMPLANT
GLOVE SURG ENC MOIS LTX SZ7.5 (GLOVE) ×2 IMPLANT
GLOVE SURG UNDER POLY LF SZ7 (GLOVE) ×2 IMPLANT
GOWN STRL REUS W/TWL LRG LVL3 (GOWN DISPOSABLE) ×4 IMPLANT
HIBICLENS CHG 4% 4OZ (MISCELLANEOUS) IMPLANT
IV NS IRRIG 3000ML ARTHROMATIC (IV SOLUTION) ×4 IMPLANT
KIT PROCEDURE FLUENT (KITS) ×2 IMPLANT
KIT TURNOVER CYSTO (KITS) ×2 IMPLANT
NEEDLE SPNL 22GX3.5 QUINCKE BK (NEEDLE) ×2 IMPLANT
PACK VAGINAL MINOR WOMEN LF (CUSTOM PROCEDURE TRAY) ×2 IMPLANT
PAD OB MATERNITY 4.3X12.25 (PERSONAL CARE ITEMS) ×2 IMPLANT
PAD PREP 24X48 CUFFED NSTRL (MISCELLANEOUS) ×2 IMPLANT
SEAL CERVICAL OMNI LOK (ABLATOR) IMPLANT
SEAL ROD LENS SCOPE MYOSURE (ABLATOR) ×2 IMPLANT
TOWEL OR 17X26 10 PK STRL BLUE (TOWEL DISPOSABLE) ×2 IMPLANT

## 2020-07-18 NOTE — Discharge Instructions (Signed)
°  Post Anesthesia Home Care Instructions  Activity: Get plenty of rest for the remainder of the day. A responsible individual must stay with you for 24 hours following the procedure.  For the next 24 hours, DO NOT: -Drive a car -Paediatric nurse -Drink alcoholic beverages -Take any medication unless instructed by your physician -Make any legal decisions or sign important papers.  Meals: Start with liquid foods such as gelatin or soup. Progress to regular foods as tolerated. Avoid greasy, spicy, heavy foods. If nausea and/or vomiting occur, drink only clear liquids until the nausea and/or vomiting subsides. Call your physician if vomiting continues.  Special Instructions/Symptoms: Your throat may feel dry or sore from the anesthesia or the breathing tube placed in your throat during surgery. If this causes discomfort, gargle with warm salt water. The discomfort should disappear within 24 hours.   DISCHARGE INSTRUCTIONS: D&C / D&E The following instructions have been prepared to help you care for yourself upon your return home.   Personal hygiene:  Use sanitary pads for vaginal drainage, not tampons.  Shower the day after your procedure.  NO tub baths, pools or Jacuzzis for 2-3 weeks.  Wipe front to back after using the bathroom.  Activity and limitations:  Do NOT drive or operate any equipment for 24 hours. The effects of anesthesia are still present and drowsiness may result.  Do NOT rest in bed all day.  Walking is encouraged.  Walk up and down stairs slowly.  You may resume your normal activity in one to two days or as indicated by your physician.  Sexual activity: NO intercourse for at least 2 weeks after the procedure, or as indicated by your physician.  Diet: Eat a light meal as desired this evening. You may resume your usual diet tomorrow.  Return to work: You may resume your work activities in one to two days or as indicated by your doctor.  What to expect  after your surgery: Expect to have vaginal bleeding/discharge for 2-3 days and spotting for up to 10 days. It is not unusual to have soreness for up to 1-2 weeks. You may have a slight burning sensation when you urinate for the first day. Mild cramps may continue for a couple of days. You may have a regular period in 2-6 weeks.  Call your doctor for any of the following:  Excessive vaginal bleeding, saturating and changing one pad every hour.  Inability to urinate 6 hours after discharge from hospital.  Pain not relieved by pain medication.  Fever of 100.4 F or greater.  Unusual vaginal discharge or odor.  No ibuprofen, Advil, Aleve, Motrin, or naproxen until after 8 pm today if needed.

## 2020-07-18 NOTE — Transfer of Care (Signed)
Immediate Anesthesia Transfer of Care Note  Patient: Christina Olson  Procedure(s) Performed: DILATATION & CURETTAGE/HYSTEROSCOPY WITH MYOSURE, REMOVAL OF IUD (N/A Uterus) HYSTEROSCOPY WITH NOVASURE (N/A Uterus)  Patient Location: PACU  Anesthesia Type:General  Level of Consciousness: drowsy and patient cooperative  Airway & Oxygen Therapy: Patient Spontanous Breathing and Patient connected to nasal cannula oxygen  Post-op Assessment: Report given to RN and Post -op Vital signs reviewed and stable  Post vital signs: Reviewed and stable  Last Vitals:  Vitals Value Taken Time  BP 111/73 07/18/20 1417  Temp    Pulse 58 07/18/20 1419  Resp 18 07/18/20 1419  SpO2 100 % 07/18/20 1419  Vitals shown include unvalidated device data.  Last Pain:  Vitals:   07/18/20 1200  TempSrc: Oral  PainSc: 2       Patients Stated Pain Goal: 5 (69/45/03 8882)  Complications: No complications documented.

## 2020-07-18 NOTE — Anesthesia Preprocedure Evaluation (Signed)
Anesthesia Evaluation  Patient identified by MRN, date of birth, ID band Patient awake    Reviewed: Allergy & Precautions, NPO status , Patient's Chart, lab work & pertinent test results  Airway Mallampati: II  TM Distance: >3 FB Neck ROM: Full    Dental no notable dental hx. (+) Teeth Intact   Pulmonary neg pulmonary ROS,    Pulmonary exam normal breath sounds clear to auscultation       Cardiovascular negative cardio ROS Normal cardiovascular exam Rhythm:Regular Rate:Normal     Neuro/Psych PSYCHIATRIC DISORDERS Anxiety Depression negative neurological ROS     GI/Hepatic negative GI ROS, Neg liver ROS,   Endo/Other  negative endocrine ROS  Renal/GU negative Renal ROS  negative genitourinary   Musculoskeletal negative musculoskeletal ROS (+)   Abdominal   Peds  Hematology negative hematology ROS (+)   Anesthesia Other Findings   Reproductive/Obstetrics AUB Uterine fibroids                             Anesthesia Physical Anesthesia Plan  ASA: II  Anesthesia Plan: General   Post-op Pain Management:    Induction: Intravenous  PONV Risk Score and Plan: 4 or greater and Treatment may vary due to age or medical condition, Midazolam and Ondansetron  Airway Management Planned: LMA  Additional Equipment:   Intra-op Plan:   Post-operative Plan: Extubation in OR  Informed Consent: I have reviewed the patients History and Physical, chart, labs and discussed the procedure including the risks, benefits and alternatives for the proposed anesthesia with the patient or authorized representative who has indicated his/her understanding and acceptance.     Dental advisory given  Plan Discussed with: CRNA and Anesthesiologist  Anesthesia Plan Comments:         Anesthesia Quick Evaluation

## 2020-07-18 NOTE — Anesthesia Postprocedure Evaluation (Signed)
Anesthesia Post Note  Patient: Christina Olson  Procedure(s) Performed: DILATATION & CURETTAGE/HYSTEROSCOPY WITH MYOSURE, REMOVAL OF IUD (N/A Uterus) HYSTEROSCOPY WITH NOVASURE (N/A Uterus)     Patient location during evaluation: PACU Anesthesia Type: General Level of consciousness: awake and alert Pain management: pain level controlled Vital Signs Assessment: post-procedure vital signs reviewed and stable Respiratory status: spontaneous breathing, nonlabored ventilation, respiratory function stable and patient connected to nasal cannula oxygen Cardiovascular status: blood pressure returned to baseline and stable Postop Assessment: no apparent nausea or vomiting Anesthetic complications: no   No complications documented.  Last Vitals:  Vitals:   07/18/20 1445 07/18/20 1510  BP: 111/62 110/75  Pulse: (!) 42 (!) 41  Resp: 18 15  Temp:  36.7 C  SpO2: 97% 100%    Last Pain:  Vitals:   07/18/20 1510  TempSrc:   PainSc: 3                  Niaomi Cartaya

## 2020-07-18 NOTE — Anesthesia Procedure Notes (Signed)
Procedure Name: LMA Insertion Date/Time: 07/18/2020 1:30 PM Performed by: Gwyndolyn Saxon, CRNA Pre-anesthesia Checklist: Patient identified, Emergency Drugs available, Suction available and Patient being monitored Patient Re-evaluated:Patient Re-evaluated prior to induction Oxygen Delivery Method: Circle system utilized Preoxygenation: Pre-oxygenation with 100% oxygen Induction Type: IV induction Ventilation: Mask ventilation without difficulty LMA: LMA inserted LMA Size: 4.0 Number of attempts: 1 Airway Equipment and Method: Patient positioned with wedge pillow Placement Confirmation: positive ETCO2 and breath sounds checked- equal and bilateral Tube secured with: Tape Dental Injury: Teeth and Oropharynx as per pre-operative assessment  Comments: Cuff deflated prior to placing LMA; easily seated. Min pressure added to cuff to create seal

## 2020-07-18 NOTE — Op Note (Signed)
07/18/2020  2:53 PM  PATIENT:  Christina Olson  45 y.o. female  PRE-OPERATIVE DIAGNOSIS:  Abnormal Uterine Bleeding, Fibroids  POST-OPERATIVE DIAGNOSIS:  Abnormal Uterine Bleeding, Fibroids  PROCEDURE:  Procedure(s): DILATATION & CURETTAGE HYSTEROSCOPY WITH MYOSURE REMOVAL OF IUD HYSTEROSCOPY WITH NOVASURE  SURGEON:  Surgeon(s): Brien Few, MD  ASSISTANTS: none   ANESTHESIA:   General and local  ESTIMATED BLOOD LOSS: 5 mL   DRAINS: none   LOCAL MEDICATIONS USED:  MARCAINE    and Amount: 20 ml  SPECIMEN:  Source of Specimen:  EMC and fibroid fragments  DISPOSITION OF SPECIMEN:  PATHOLOGY  COUNTS:  YES  DICTATION #: [id number]  PLAN OF CARE: dc home  PATIENT DISPOSITION:  PACU - hemodynamically stable.

## 2020-07-18 NOTE — Op Note (Signed)
NAME: Christina Olson, Christina Olson MEDICAL RECORD [id number] ACCOUNT [id number] DATE OF BIRTH:06-10-1976 FACILITY: [facility] LOCATION: [facility] PHYSICIAN:Kristianna Saperstein J. Dwan Fennel, MD  OPERATIVE REPORT  DATE OF PROCEDURE:  07/18/2020  PREOPERATIVE DIAGNOSIS:  Menometrorrhagia with lost intrauterine device, submucous fibroid.  POSTOPERATIVE DIAGNOSIS:  Menometrorrhagia with lost intrauterine device, submucous fibroid.  PROCEDURE:  Diagnostic hysteroscopy, dilatation and curettage, MyoSure resection of anterior and posterior wall submucous fibroid, removal of IUD and NovaSure endometrial ablation.  SURGEON:  Brien Few, MD  ASSISTANT:  None.  ANESTHESIA:  Local, general.  ESTIMATED BLOOD LOSS:  Less than 50 mL.  FLUID DEFICIT:  2500 mL.  COMPLICATIONS:  None.  DRAINS:  Foley.  COUNTS:  Correct.  DISPOSITION:  The patient was taken to recovery in good condition.  BRIEF OPERATIVE NOTE:  After being apprised of the risks of anesthesia, infection, bleeding, injury to surrounding organs, possible need for repair, delayed versus immediate complications including bowel and bladder injury with possible need for repair.   The patient was brought to the operating room where she was administered general anesthetic without complications.  Prepped and draped in usual sterile fashion.  Feet are placed in the Yellofin stirrups.  Exam under anesthesia reveals an anteflexed  uterus and no adnexal masses.  Dilute Pitressin solution placed at 3 and 9 o'clock.  A 20 mL total dilute Marcaine solution placed.  Standard paracervical block, 20 mL total.  Cervix easily dilated up 23 Pratt dilator.  Hysteroscope placed.   Visualization reveals a large posterior wall submucous fibroid and a small anterior wall submucous fibroid.  IUD is seen anterior to the submucous fibroid and was removed without difficulty.  D and C performed in a 4-quadrant method using sharp  curettage.  Tissue sent with specimen.  MyoSure device  was entered and resection of the anterior wall fibroid done without difficulty down to the base.  Posterior wall fibroid additionally resected almost to the base.  Fluid deficit was 2500 mL limiting  the complete resection of that fibroid to the base.  At this time, procedure was terminated.  NovaSure device was placed, seated to a length of 6.5 and a width of 2.5, initiated after negative CO2 test to 89 watts for 35 seconds without difficulty.  The  device was removed without difficulty.  The patient tolerated the procedure well and was transferred to recovery in good condition.  HN/NUANCE  D:07/18/2020 T:07/18/2020 JOB:[id number]

## 2020-07-18 NOTE — H&P (Addendum)
Christina Olson is an 45 y.o. female. Menometrorrhagia for IUD removal, resection of SM fibroid and EAB  Pertinent Gynecological History: Menses: flow is moderate Bleeding: intermenstrual bleeding Contraception: none DES exposure: denies Blood transfusions: none Sexually transmitted diseases: no past history Previous GYN Procedures: DNC  Last mammogram: normal Date: 2020 Last pap: normal Date: 2021 OB History: G1, P2   Menstrual History: Menarche age: 20 No LMP recorded. (Menstrual status: IUD).    Past Medical History:  Diagnosis Date  . Abnormal uterine bleeding (AUB)   . History of kidney stones   . Infertility, female   . Polycystic ovaries   . Situational mixed anxiety and depressive disorder    anxiety about flying  . Uterine fibroid     Past Surgical History:  Procedure Laterality Date  . CESAREAN SECTION  04-16-2010  @[facility]   . EXTRACORPOREAL SHOCK WAVE LITHOTRIPSY  12-16-2011  @[facility]   . KNEE ARTHROSCOPY W/ ACL RECONSTRUCTION Left x3  last one 2017   includes ACL reconstruction/ menisectomy's/ manipulation under anes/ and re-do ACL reconstruction   . MYOMECTOMY ABDOMINAL APPROACH  08/2008    Family History  Problem Relation Age of Onset  . Heart attack Father 67  . Hypertension Other     Social History:  reports that she has never smoked. She has never used smokeless tobacco. She reports current alcohol use of about 2.0 standard drinks of alcohol per week. She reports that she does not use drugs.  Allergies: No Known Allergies  No medications prior to admission.    Review of Systems  Constitutional: Negative.   All other systems reviewed and are negative.   Height 5' 3"  (1.6 m), weight 59 kg. Physical Exam Vitals and nursing note reviewed.  Constitutional:      Appearance: Normal appearance.  HENT:     Head: Normocephalic and atraumatic.  Cardiovascular:     Rate and Rhythm: Normal rate and regular rhythm.     Pulses: Normal pulses.     Heart  sounds: Normal heart sounds.  Pulmonary:     Effort: Pulmonary effort is normal.     Breath sounds: Normal breath sounds.  Abdominal:     Palpations: Abdomen is soft.  Genitourinary:    General: Normal vulva.  Musculoskeletal:        General: Normal range of motion.     Cervical back: Normal range of motion and neck supple.  Skin:    General: Skin is warm and dry.  Neurological:     General: No focal deficit present.     Mental Status: She is alert and oriented to person, place, and time.  Psychiatric:        Mood and Affect: Mood normal.        Behavior: Behavior normal.     No results found for this or any previous visit (from the past 24 hour(s)).  No results found.  Assessment/Plan: Menometrorrhagia Diag HS, IUD removal , Myosure, EAB Surgical consent done. Risks of anesthesia , infection, bleeding with possible injury to surrounding organs discussed and acknowledged.   Mika Griffitts J 07/18/2020, 6:52 AM

## 2020-07-19 ENCOUNTER — Encounter (HOSPITAL_BASED_OUTPATIENT_CLINIC_OR_DEPARTMENT_OTHER): Payer: Self-pay | Admitting: Obstetrics and Gynecology

## 2020-07-19 LAB — SURGICAL PATHOLOGY

## 2021-04-19 LAB — COLOGUARD: COLOGUARD: NEGATIVE

## 2022-08-08 NOTE — Progress Notes (Signed)
 Medical visit  Program: essentials  Stage: week 52  Plan: prescribed 1200 kcal    08/08/2022   CC: Christina Olson returns to follow up on treatment of excess body weight and associated risk factors/co-morbidities  Today's visit was completed via a real-time telehealth (see specific modality noted below). The patient/authorized person provided oral consent at the time of the visit to engage in a telemedicine encounter with the present provider at [facility]. The patient/authorized person was informed of the potential benefits, limitations, and risks of telemedicine. The patient/authorized person expressed understanding that the laws that protect confidentiality also apply to telemedicine. The patient/authorized person acknowledged understanding that telemedicine does not provide emergency services and that he or she would need to call 911 or proceed to the nearest hospital for help if such a need arose.  Total time spent in the clinical discussion 18 minutes. Telehealth Modality: Video visit (audio and video) State of Patient Permanent Address: [place]  Patient Location (at time of visit): Home/Other Non-Medical   Provider Location (at time of visit): Home  Interim history: Christina Olson continues to have tremendous consistency with her behaviors.  Highly insightful of eating patterns- high quality of nutrition.  Went back to eating every 3-4 hours rather than 3 larger meals as this seems to help manage energy/hunger better.    Highly active and using a program that selects her session each day- allows for variety and easier that she doesn't have to think about what to do for her work-outs.  High daily steps, including strength and feels stronger with this.   More balanced with her approach to both eating and activity and less focused on the number on the scale.    Coping well with stressors and sleep has been stable.   Knows when cravings hit and which foods can be triggers Had repeat  body composition testing- body fat % remains very low, waist circumference normal and FFM >100 lbs. Continues to weigh regularly  Had planned on having 3rd ACL reconstruction end of last year but decided to delay- not causing significant interruptions to her daily activities  Using a brace for stability during exercise   Other problems being monitored/treated and associated ROS: denies any CP, SOB, palpitations.  Not feeling dizzy, lightheaded, weakness or fatigued.  No mood or sleep disruptions.  Otherwise unremarkable    The following portions of the patient's history were reviewed and updated as appropriate: current medications and problem list.    Wt Readings from Last 4 Encounters:  08/08/22 59.9 kg (132 lb)  07/30/22 60.2 kg (132 lb 11.2 oz)  06/12/22 60.9 kg (134 lb 4.2 oz)  04/05/22 58.5 kg (129 lb)  all carium data personally reviewed Recent body comp personally reviewed     Assessment/Plan    ICD-10-CM   1. Weight loss counseling, encounter for  Z71.3     2. BMI 23.0-23.9, adult  Z68.23       I discussed several key areas of the patient's treatment plan including   Christina Olson has had about 4-5% weight reduction over past year, tremendous stability within 1-2% over past 3-6 months Body fat % ideal and in athletic range Waist circumference normal Excellent metabolic parameters including BP, cholesterol, blood glucose,  Discussed at length risk of weight gain, reduced metabolic flexibiity, changes to fat distribution with perimenopause Continue to change activity prescription with type, time, frequency, intensity Reasonable to alternate eating patterns to avoid behavior fatigue and help with maintenance-  Would repeat body comp  4-6 months Consider repeating RMR once yearly Ongoing use of self-monitoring   Next follow up will be in 4 months with body comp; in the interim we discussed that the patient will  Ongoing self monitoring- weigh minimum once weekly Continue to  adjust activity High quality nutrition Goal weight maintenance   I have personally spent 24 minutes involved in face-to-face and non-face-to-face activities for this patient on the day of the visit.  Professional time spent includes the following activities, in addition to those noted in the documentation: preparing to see the patient by review of history and previous tests; counseling and educating the patient; ordering medications, tests, or procedures; referring and communicating with other health care professionals; and documenting clinical information in the health record.      Electronically signed by: Harlene Lamarr Lapine, MD 08/08/2022 11:24 AM     Electronically signed by: Lapine Harlene Lamarr, MD 08/08/22 1125

## 2024-03-09 NOTE — Progress Notes (Signed)
 Subjective:    Subjective  Christina Olson is a 48 y.o. female who presents for Insect Bite (Pt presents today with c/o having a bee sting on her right wrist  that has some swelling.)  History of Present Illness This is a female with no known allergies presenting with persistent swelling from a bee sting.  The patient reports that she was stung by a bee on 03/07/2024 while doing yard work. She did not see the insect, but her husband later identified a yellow jacket in the area. The swelling has been increasing and is causing discomfort, although she is not experiencing any systemic symptoms such as fever, chills, shortness of breath, difficulty swallowing, or drooling. The discomfort is intermittent and primarily due to the swelling. She has been managing the discomfort with ibuprofen, which she took before bed last night. She has not tried Benadryl  due to concerns about drowsiness. She is planning to travel tomorrow to an area with limited access to medical care and is concerned about the potential for the condition to worsen. She has full range of motion in her hand and wrist although she reports pain with movement due to swelling. She does not have a known allergy to insects or bees and has never experienced an anaphylactic reaction.  History reviewed. No pertinent past medical history.  Allergies[1]   History reviewed. No pertinent surgical history.  History reviewed. No pertinent family history.  Social History[2]   Tobacco Use: Low Risk  (08/08/2022)   Received from [facility] visits prior to 08/10/2022., [facility]   Patient History   . Smoking Tobacco Use: Never   . Smokeless Tobacco Use: Never   . Passive Exposure: Not on file    Problem List[3]  Medications Ordered Prior to Encounter[4]   Past medical, surgical and family history reviewed.   Review of Systems  Constitutional:  Negative for chills and fever.  Respiratory:  Negative for  cough and shortness of breath.   Cardiovascular:  Negative for chest pain.  Gastrointestinal:  Negative for nausea and vomiting.  Musculoskeletal:  Positive for joint pain and myalgias. Negative for back pain and neck pain.  Skin:  Positive for itching (intermittent) and rash.  Neurological:  Negative for sensory change, focal weakness, loss of consciousness, weakness and headaches.    Objective:   Vitals:   03/09/24 1153  BP: 129/80  Pulse: 67  Resp: 16  Temp: 99 F (37.2 C)  TempSrc: Tympanic  SpO2: 99%  Weight: 135 lb (61.2 kg)  Height: 5' 2 (1.575 m)    Physical Exam Vitals and nursing note reviewed.  Constitutional:      General: She is not in acute distress.    Appearance: Normal appearance. She is not ill-appearing, toxic-appearing or diaphoretic.  HENT:     Head: Normocephalic and atraumatic.     Right Ear: Tympanic membrane, ear canal and external ear normal.     Left Ear: Tympanic membrane, ear canal and external ear normal.     Nose: Nose normal. No congestion or rhinorrhea.     Mouth/Throat:     Mouth: Mucous membranes are moist.     Pharynx: Oropharynx is clear. No oropharyngeal exudate or posterior oropharyngeal erythema.  Eyes:     General:        Right eye: No discharge.        Left eye: No discharge.     Extraocular Movements: Extraocular movements intact.     Conjunctiva/sclera: Conjunctivae normal.  Cardiovascular:     Rate and Rhythm: Normal rate and regular rhythm.     Pulses: Normal pulses.     Heart sounds: Normal heart sounds.  Pulmonary:     Effort: Pulmonary effort is normal.     Breath sounds: Normal breath sounds.  Musculoskeletal:       Arms:     Cervical back: Normal range of motion.     Comments: Erythema noted on posterior right forearm beginning at wrist and extending up to just inferior to elbow.  Moderate swelling appreciated.  Mildly TTP.  Full ROM of right wrist but reported discomfort due to swelling.  Full ROM of right  elbow.  No visible lesions or drainage noted.  No streaking noted.  Lymphadenopathy:     Cervical: No cervical adenopathy.  Skin:    General: Skin is warm and dry.     Findings: No lesion or rash.  Neurological:     General: No focal deficit present.     Mental Status: She is alert and oriented to person, place, and time.  Psychiatric:        Mood and Affect: Mood normal.        Behavior: Behavior normal.      Assessment:   Laboratory Testing Results of tests available at time of visit: No results found for this or any previous visit (from the past 24 hours).   Radiology No results found.   Plan:   1. Allergic reaction, initial encounter   2. Bee sting, accidental or unintentional, initial encounter   3. Localized swelling of right forearm      Assessment & Plan Initial Assessment: Bee sting occurred 2 days ago with persistent swelling and discomfort. Localized allergic reaction. No signs or history of anaphylaxis.  Differential Diagnosis: - Anaphylaxis unlikely: No fever, chills, shortness of breath, throat closing, trouble swallowing, drooling, nausea, vomiting. - Localized allergic reaction: Persistent swelling and discomfort. Plan: Intramuscular steroid injection and prescription for steroid cream. Benadryl  and Pepcid recommended. Ice application for symptomatic relief.  Urgent Care Course: - Discussed treatment options: Intramuscular steroid injection vs. steroid cream. Patient requested IM injection of steroid in addition to steroid cream. - Advised Benadryl  (diphenhydramine ) and Pepcid (famotidine). - Recommended ice application for symptomatic relief.  Final Assessment: Discussed treatment options including intramuscular steroid injection or steroid cream. Advised Benadryl  and Pepcid. Recommended ice application.  Clinical Impression: - Localized allergic reaction to bee sting.  Disposition: - Follow-Up: Return for further evaluation if symptoms  persist.  Patient Education: Discussed treatment options including intramuscular steroid injection or steroid cream. Advised Benadryl  and Pepcid. Recommended ice application.    Patient's Medications       * Accurate as of March 09, 2024 12:37 PM. Reflects encounter med changes as of last refresh          New Prescriptions      Instructions  triamcinolone 0.5% cream Commonly known as: ARISTOCORT,KENALOG Started by: Lissa Counter, PA-C  Topical, 2 times a day         Advised patient to follow up with their PCP in 3 days or RTC this week if condition persists.  Advised in detail of when to go to the Emergency Department for immediate evaluation.  Patient verbalized understanding and agreement.   MEDICAL DECISION MAKING  Low risk as patient has no additional symptoms other than localized erythema and swelling.  No history of anaphylaxis.     Patient's Medications  New Prescriptions   TRIAMCINOLONE (ARISTOCORT,KENALOG) 0.5%  CREAM    Apply topically 2 (two) times daily for 7 days.     Overall Visit Complexity : Due to the patient's presenting symptoms, vital signs, comorbid conditions, complexity of data, and the risk of patient management, this is a low complexity visit.  Previsit planning was completed via snapshot and review of chart.  Patient/family verbalized to me that they understood what their problem is, what they need to do about it, and why it is important that they do it.  The patient/family voices understanding of all medications. No barriers to adherence were noted. Patient is taking all medications as prescribed and is tolerating well.  Plan for follow-up as discussed or as needed if any worsening symptoms or change in condition.  After Visit Summary was offered and/or provided via MyChart.  Follow up instructions  Patient Instructions  You may take OTC Benadryl  and OTC Pepcid as directed to help alleviate symptoms. Use all medications and take all  medications as directed.  GO TO THE ED IF YOU EXPERIENCE ANY OF THE FOLLOWING: Trouble breathing, wheezing, or a cough that won't stop Chest tightness or pain Feeling like your throat is closing, swelling of your lips, or swelling of your tongue Feeling very weak like you are going to pass out or actually passing out If you have signs of a severe allergic reaction and have epinephrine with you, use it right away. Then, call for an ambulance.  FOLLOW UP WITH YOUR PCP IF YOU EXPERIENCE ANY OF THE FOLLOWING:   Are not having a severe reaction, but do have other signs of an allergic reaction, such as: a rash, skin redness, flushing, hives, itching, abdominal pain, nausea, not feeling better in 2-3 days, or new/worsening symptoms.   The patient indicates understanding of these issues and agrees with the plan. Lissa GORMAN Counter, PA-C    Publishing Rights Manager was used to create visit note. Consent from the patient/caregiver was obtained prior to its use.            [1] No Known Allergies [2] Social History Socioeconomic History  . Marital status: Married  [3] There is no problem list on file for this patient.  [4] No current outpatient medications on file prior to visit.   No current facility-administered medications on file prior to visit.  *Some images could not be shown.

## 2024-04-23 ENCOUNTER — Emergency Department (HOSPITAL_COMMUNITY)

## 2024-04-23 ENCOUNTER — Emergency Department (HOSPITAL_COMMUNITY)
Admission: EM | Admit: 2024-04-23 | Discharge: 2024-04-23 | Disposition: A | Discharge status: Home / Self Care | Attending: Emergency Medicine | Admitting: Emergency Medicine

## 2024-04-23 ENCOUNTER — Other Ambulatory Visit: Payer: Self-pay

## 2024-04-23 DIAGNOSIS — S0101XA Laceration without foreign body of scalp, initial encounter: Secondary | ICD-10-CM | POA: Diagnosis not present

## 2024-04-23 DIAGNOSIS — S0990XA Unspecified injury of head, initial encounter: Secondary | ICD-10-CM

## 2024-04-23 DIAGNOSIS — R55 Syncope and collapse: Secondary | ICD-10-CM | POA: Diagnosis not present

## 2024-04-23 DIAGNOSIS — R079 Chest pain, unspecified: Secondary | ICD-10-CM | POA: Diagnosis not present

## 2024-04-23 DIAGNOSIS — R509 Fever, unspecified: Secondary | ICD-10-CM | POA: Insufficient documentation

## 2024-04-23 DIAGNOSIS — W01198A Fall on same level from slipping, tripping and stumbling with subsequent striking against other object, initial encounter: Secondary | ICD-10-CM | POA: Insufficient documentation

## 2024-04-23 DIAGNOSIS — Z23 Encounter for immunization: Secondary | ICD-10-CM | POA: Insufficient documentation

## 2024-04-23 LAB — CBC
HCT: 38.4 % (ref 36.0–46.0)
Hemoglobin: 13 g/dL (ref 12.0–15.0)
MCH: 31.9 pg (ref 26.0–34.0)
MCHC: 33.9 g/dL (ref 30.0–36.0)
MCV: 94.1 fL (ref 80.0–100.0)
Platelets: 206 K/uL (ref 150–400)
RBC: 4.08 MIL/uL (ref 3.87–5.11)
RDW: 12.1 % (ref 11.5–15.5)
WBC: 8.9 K/uL (ref 4.0–10.5)
nRBC: 0 % (ref 0.0–0.2)

## 2024-04-23 LAB — COMPREHENSIVE METABOLIC PANEL WITH GFR
ALT: 19 U/L (ref 0–44)
AST: 20 U/L (ref 15–41)
Albumin: 3.7 g/dL (ref 3.5–5.0)
Alkaline Phosphatase: 42 U/L (ref 38–126)
Anion gap: 12 (ref 5–15)
BUN: 19 mg/dL (ref 6–20)
CO2: 21 mmol/L — ABNORMAL LOW (ref 22–32)
Calcium: 8.5 mg/dL — ABNORMAL LOW (ref 8.9–10.3)
Chloride: 104 mmol/L (ref 98–111)
Creatinine, Ser: 0.91 mg/dL (ref 0.44–1.00)
GFR, Estimated: >60 mL/min (ref >60)
Glucose, Bld: 125 mg/dL — ABNORMAL HIGH (ref 70–99)
Potassium: 4 mmol/L (ref 3.5–5.1)
Sodium: 137 mmol/L (ref 135–145)
Total Bilirubin: 0.8 mg/dL (ref 0.0–1.2)
Total Protein: 6.5 g/dL (ref 6.5–8.1)

## 2024-04-23 LAB — HCG, SERUM, QUALITATIVE: Preg, Serum: NEGATIVE

## 2024-04-23 LAB — CBG MONITORING, ED: Glucose-Capillary: 124 mg/dL — ABNORMAL HIGH (ref 70–99)

## 2024-04-23 MED ORDER — TETANUS-DIPHTH-ACELL PERTUSSIS 5-2-15.5 LF-MCG/0.5 IM SUSP
0.5000 mL | Freq: Once | INTRAMUSCULAR | Status: AC
  Administered 2024-04-23: 0.5 mL via INTRAMUSCULAR
  Filled 2024-04-23: qty 0.5

## 2024-04-23 MED ORDER — SODIUM CHLORIDE 0.9 % IV BOLUS
1000.0000 mL | Freq: Once | INTRAVENOUS | Status: AC
  Administered 2024-04-23: 1000 mL via INTRAVENOUS

## 2024-04-23 MED ORDER — ACETAMINOPHEN 500 MG PO TABS
1000.0000 mg | ORAL_TABLET | Freq: Once | ORAL | Status: AC
  Administered 2024-04-23: 1000 mg via ORAL
  Filled 2024-04-23: qty 2

## 2024-04-23 MED ORDER — LIDOCAINE-EPINEPHRINE (PF) 2 %-1:200000 IJ SOLN
10.0000 mL | Freq: Once | INTRAMUSCULAR | Status: AC
  Administered 2024-04-23: 10 mL

## 2024-04-23 MED ORDER — ONDANSETRON 4 MG PO TBDP: 4.0000 mg | ORAL_TABLET | Freq: Three times a day (TID) | ORAL | 0 refills | Status: AC | PRN

## 2024-04-23 MED ORDER — ONDANSETRON HCL 4 MG/2ML IJ SOLN
4.0000 mg | Freq: Once | INTRAMUSCULAR | Status: AC
  Administered 2024-04-23: 4 mg via INTRAVENOUS
  Filled 2024-04-23: qty 2

## 2024-04-23 NOTE — ED Triage Notes (Signed)
 Pt arrives via EMS from home for syncopal episode once getting up from toilet. Denies straining on toilet but upon standing had sudden dizziness, nausea, and fainted and possible fever at home. Husband had to help pt down stairs where pt had similar symptoms but did not loss consciousness, sat down at end of stairs and waited for EMS.  Took tylenol  approx 0000 Laceration to back of head, bleeding controlled No thinners Orthostatics with EMS-  Sitting 112/68 Stand 92/72  124/72  82 HR  16 RR  98% RA  125 CBG Aox4

## 2024-04-23 NOTE — Discharge Instructions (Addendum)
 You were seen today for passing out. While you were here we monitored your vitals, performed a physical exam, blood work and CT scan. These were all reassuring and there is no indication for any further testing or intervention in the emergency department at this time.  You have staples in your scalp, is important to get these removed in 10 days  Things to do:  - Keep your scalp wound clean and dry - Visit your primary care doctor or urgent care for staple removal in 10 days  Return to the emergency department if you have any new or worsening symptoms including chest pain, shortness of breath, passing out, or if you have any other concerns.

## 2024-04-23 NOTE — ED Provider Notes (Signed)
  EMERGENCY DEPARTMENT AT [facility] Provider Note   CSN: [id number] Arrival date & time: 04/23/24  9392     Patient presents with: Loss of Consciousness   Christina Olson is a 48 y.o. female with history of recent left ACL repair presenting to the emergency department for CP.  Patient reports she awoke in the the middle of the night to use the restroom.  She reports she stood up felt warm, nauseous, lightheaded and then fainted.  She denies nausea, vomiting, diarrhea, and straining to pass a bowel movement.  Patient awoke on the bathroom floor and is unsure how long she was passed out.  She fell blood on the back of her head.  Patient also endorses some subjective fevers, chills, and general malaise at home.  Both of her children are sick with hand-foot-and-mouth disease.  Patient called EMS patient is unable to get down the stairs independently.  On arrival to the ED, patient endorses headache but denies any other acute symptoms.     Loss of Consciousness      Prior to Admission medications   Medication Sig Start Date End Date Taking? Authorizing Provider  ondansetron  (ZOFRAN -ODT) 4 MG disintegrating tablet Take 1 tablet (4 mg total) by mouth every 8 (eight) hours as needed for nausea or vomiting. 04/23/24  Yes Sharlet Dowdy, MD  ASHWAGANDHA PO Take by mouth daily.    [provider]  levonorgestrel (MIRENA) 20 MCG/24HR IUD 1 each by Intrauterine route once.    [provider]  traMADol  (ULTRAM ) 50 MG tablet Take 1-2 tablets (50-100 mg total) by mouth every 6 (six) hours as needed. 07/18/20   Taavon, Richard, MD    Allergies: Patient has no known allergies.    Review of Systems  Cardiovascular:  Positive for syncope.    Updated Vital Signs BP (!) 104/59   Pulse 85   Temp 100.1 F (37.8 C) (Oral)   Resp (!) 23   Ht 5' 3 (1.6 m)   Wt 60.8 kg   SpO2 99%   BMI 23.74 kg/m   Physical Exam Vitals and nursing note reviewed.   Constitutional:      General: She is not in acute distress.    Appearance: She is well-developed.  HENT:     Head: Normocephalic.     Comments: 5 cm laceration over left parietal scalp Eyes:     Conjunctiva/sclera: Conjunctivae normal.  Cardiovascular:     Rate and Rhythm: Normal rate and regular rhythm.     Heart sounds: No murmur heard. Pulmonary:     Effort: Pulmonary effort is normal. No respiratory distress.     Breath sounds: Normal breath sounds.  Abdominal:     Palpations: Abdomen is soft.     Tenderness: There is no abdominal tenderness.  Musculoskeletal:     Cervical back: Neck supple.     Comments: Left lower extremity knee mobilizer in place  Skin:    General: Skin is warm and dry.     Capillary Refill: Capillary refill takes less than 2 seconds.  Neurological:     General: No focal deficit present.     Mental Status: She is alert and oriented to person, place, and time. Mental status is at baseline.     Cranial Nerves: No cranial nerve deficit.     Sensory: Sensory deficit present.     Motor: No weakness.     (all labs ordered are listed, but only abnormal results are displayed) Labs  Reviewed  COMPREHENSIVE METABOLIC PANEL WITH GFR - Abnormal; Notable for the following components:      Result Value   CO2 21 (*)    Glucose, Bld 125 (*)    Calcium 8.5 (*)    All other components within normal limits  CBG MONITORING, ED - Abnormal; Notable for the following components:   Glucose-Capillary 124 (*)    All other components within normal limits  CBC  HCG, SERUM, QUALITATIVE  URINALYSIS, ROUTINE W REFLEX MICROSCOPIC    EKG: EKG Interpretation Date/Time:  Friday April 23 2024 93:71:[id number] EST Ventricular Rate:  84 PR Interval:  166 QRS Duration:  89 QT Interval:  368 QTC Calculation: 435 R Axis:   14  Text Interpretation: Sinus rhythm Confirmed by Trine Likes [phone]) on 04/23/2024 6:31:58 AM  Radiology: CT Head Wo Contrast Result Date:  04/23/2024 EXAM: CT HEAD AND CERVICAL SPINE 04/23/2024 07:30:40 AM TECHNIQUE: CT of the head and cervical spine was performed without the administration of intravenous contrast. Multiplanar reformatted images are provided for review. Automated exposure control, iterative reconstruction, and/or weight based adjustment of the mA/kV was utilized to reduce the radiation dose to as low as reasonably achievable. COMPARISON: None available. CLINICAL HISTORY: Blunt polytrauma due to syncopal episode with loss of consciousness. FINDINGS: CT HEAD BRAIN AND VENTRICLES: There is mild cortical volume loss in both cerebral hemispheres. The cerebellum and brainstem are unremarkable. The gray and white matter attenuation and differentiation are normal. No acute intracranial hemorrhage. No mass effect or midline shift. No abnormal extra-axial fluid collection. No evidence of acute infarct. No hydrocephalus. ORBITS: No acute abnormality. SINUSES AND MASTOIDS: There is fluid in the right sphenoid sinus. SOFT TISSUES AND SKULL: There is a left posterior high parietal convexity scalp laceration with mild adjacent scalp swelling. No space-occupying scalp hematoma. There are no depressed skull fractures. CT CERVICAL SPINE BONES AND ALIGNMENT: There is a reversed cervical lordosis centered at C4-C5. No fracture, listhesis, or widening of the anterior atlanto-dental joint is seen. There is no evidence of fractures or focal pathologic process. DEGENERATIVE CHANGES: There is mild disc space loss at C4-C5 and C5-C6. The discs are normal in height elsewhere. There are small bidirectional osteophytes from C3-C4 through C5-C6. Dorsal osteophytes at C5-C6 flatten the ventral cord surface without frank compression. There are facet joint and uncinate spurring changes with secondary foraminal stenosis which is moderate on the left at C3-C4, bilaterally mild to moderate at C4-C5, and bilaterally moderate at C5-C6 with other foramina remaining patent.  Other levels do not show significant bony or soft tissue encroachment of the thecal sac. SOFT TISSUES: No prevertebral soft tissue swelling. IMPRESSION: 1. No acute intracranial CT findings or depressed skull fractures. 2. Left posterior high parietal convexity scalp laceration with mild adjacent scalp swelling. No space-occupying scalp hematoma. 3. Reversed cervical lordosis centered at C4-C5, without acute fracture or listhesis. 4. Cervical degenerative changes. 5. Right sphenoid sinus fluid. Query acute sinusitis. Electronically signed by: Francis Quam MD 04/23/2024 07:56 AM EST RP Workstation: [id number]   CT Cervical Spine Wo Contrast Result Date: 04/23/2024 EXAM: CT HEAD AND CERVICAL SPINE 04/23/2024 07:30:40 AM TECHNIQUE: CT of the head and cervical spine was performed without the administration of intravenous contrast. Multiplanar reformatted images are provided for review. Automated exposure control, iterative reconstruction, and/or weight based adjustment of the mA/kV was utilized to reduce the radiation dose to as low as reasonably achievable. COMPARISON: None available. CLINICAL HISTORY: Blunt polytrauma due to syncopal episode with loss of consciousness.  FINDINGS: CT HEAD BRAIN AND VENTRICLES: There is mild cortical volume loss in both cerebral hemispheres. The cerebellum and brainstem are unremarkable. The gray and white matter attenuation and differentiation are normal. No acute intracranial hemorrhage. No mass effect or midline shift. No abnormal extra-axial fluid collection. No evidence of acute infarct. No hydrocephalus. ORBITS: No acute abnormality. SINUSES AND MASTOIDS: There is fluid in the right sphenoid sinus. SOFT TISSUES AND SKULL: There is a left posterior high parietal convexity scalp laceration with mild adjacent scalp swelling. No space-occupying scalp hematoma. There are no depressed skull fractures. CT CERVICAL SPINE BONES AND ALIGNMENT: There is a reversed cervical lordosis  centered at C4-C5. No fracture, listhesis, or widening of the anterior atlanto-dental joint is seen. There is no evidence of fractures or focal pathologic process. DEGENERATIVE CHANGES: There is mild disc space loss at C4-C5 and C5-C6. The discs are normal in height elsewhere. There are small bidirectional osteophytes from C3-C4 through C5-C6. Dorsal osteophytes at C5-C6 flatten the ventral cord surface without frank compression. There are facet joint and uncinate spurring changes with secondary foraminal stenosis which is moderate on the left at C3-C4, bilaterally mild to moderate at C4-C5, and bilaterally moderate at C5-C6 with other foramina remaining patent. Other levels do not show significant bony or soft tissue encroachment of the thecal sac. SOFT TISSUES: No prevertebral soft tissue swelling. IMPRESSION: 1. No acute intracranial CT findings or depressed skull fractures. 2. Left posterior high parietal convexity scalp laceration with mild adjacent scalp swelling. No space-occupying scalp hematoma. 3. Reversed cervical lordosis centered at C4-C5, without acute fracture or listhesis. 4. Cervical degenerative changes. 5. Right sphenoid sinus fluid. Query acute sinusitis. Electronically signed by: Francis Quam MD 04/23/2024 07:56 AM EST RP Workstation: [id number]   DG Chest Portable 1 View Result Date: 04/23/2024 CLINICAL DATA:  Syncope. EXAM: PORTABLE CHEST 1 VIEW COMPARISON:  None Available. FINDINGS: The lungs are clear without focal pneumonia, edema, pneumothorax or pleural effusion. The cardiopericardial silhouette is within normal limits for size. No acute bony abnormality. Telemetry leads overlie the chest. IMPRESSION: No active disease. Electronically Signed   By: Camellia Candle M.D.   On: 04/23/2024 07:20     .Laceration Repair  Date/Time: 04/23/2024 9:31 AM  Performed by: Sharlet Dowdy, MD Authorized by: Ginger Lonni PARAS, MD   Consent:    Consent obtained:  Verbal   Consent given  by:  Patient   Risks, benefits, and alternatives were discussed: yes     Risks discussed:  Infection, pain, retained foreign body, poor cosmetic result, need for additional repair, nerve damage, poor wound healing, tendon damage and vascular damage   Alternatives discussed:  No treatment and observation Universal protocol:    Procedure explained and questions answered to patient or proxy's satisfaction: yes     Relevant documents present and verified: yes     Test results available: yes     Imaging studies available: yes     Site/side marked: yes     Immediately prior to procedure, a time out was called: yes     Patient identity confirmed:  Verbally with patient and arm band Anesthesia:    Anesthesia method:  Local infiltration   Local anesthetic:  Lidocaine  2% WITH epi Laceration details:    Location:  Scalp   Scalp location:  L parietal   Length (cm):  5 Pre-procedure details:    Preparation:  Imaging obtained to evaluate for foreign bodies Exploration:    Hemostasis achieved with:  Direct pressure  Imaging obtained comment:  CT   Imaging outcome: foreign body not noted     Wound exploration: wound explored through full range of motion and entire depth of wound visualized     Wound extent: fascia not violated, no foreign body, no signs of injury, no nerve damage, no tendon damage, no underlying fracture and no vascular damage     Contaminated: no   Treatment:    Area cleansed with:  Saline   Amount of cleaning:  Standard   Irrigation solution:  Sterile saline   Irrigation method:  Syringe   Visualized foreign bodies/material removed: no     Debridement:  None   Undermining:  None   Scar revision: no   Skin repair:    Repair method:  Staples   Number of staples:  4 Approximation:    Approximation:  Close Repair type:    Repair type:  Simple Post-procedure details:    Dressing:  Open (no dressing)   Procedure completion:  Tolerated    Medications Ordered in the ED   sodium chloride  0.9 % bolus 1,000 mL (0 mLs Intravenous Stopped 04/23/24 0920)  lidocaine -EPINEPHrine (XYLOCAINE  W/EPI) 2 %-1:200000 (PF) injection 10 mL (10 mLs Infiltration Given by Other 04/23/24 0930)  ondansetron  (ZOFRAN ) injection 4 mg (4 mg Intravenous Given 04/23/24 0928)  Tdap (ADACEL) injection 0.5 mL (0.5 mLs Intramuscular Given by Other 04/23/24 0955)  acetaminophen  (TYLENOL ) tablet 1,000 mg (1,000 mg Oral Given by Other 04/23/24 0955)    Clinical Course as of 04/23/24 1034  Fri Apr 23, 2024  0715 CBG monitoring, ED(!) No hypoglycemia  [LB]  0715 hCG, serum, qualitative Not pregnant  [LB]  0716 EKG 12-Lead Normal sinus rhythm, no evidence of acute ischemia or arrhythmia  [LB]  0816 Comprehensive metabolic panel(!) No significant electrolyte abnormalities [LB]  0817 CBC No leukocytosis or anemia [LB]  0817 CT Head Wo Contrast No skull fracture or intracranial hemorrhage [LB]  0817 CT Cervical Spine Wo Contrast No C-spine fracture or traumatic malalignment [LB]    Clinical Course User Index [LB] Sharlet Dowdy, MD                                 Medical Decision Making Patient is an otherwise healthy 48 year old female presenting to the emergency department after syncopal episode.  Patient endorses general malaise with fevers and chills in the setting of likely a viral illness given both her children have coxsackie.  On evaluation, patient is hemodynamically stable, afebrile, with a reassuring neuroexam.  Patient's only acute injury is a 5 cm laceration to the posterior scalp.    Differential diagnosis includes but limited to vasovagal syncope, orthostatic syncope, cardiogenic syncope, arrhythmia, PE  Patient 5 weeks out from ACL surgery, no tachypnea, tachycardia, SOB, or new O2 requirement. Patient on ASA for DVT ppx and no unilateral leg swelling. Low risk Well's score, no additional PE work up indicated. Because patient had an unknown period of LOC with head wound, CT  head and spine were ordered.  Additional syncope workup was also initiated.  Patient given 1 L IV fluid bolus.  EKG reassuring revealing normal sinus rhythm, no evidence of arrhythmia or acute ischemia.  Labs without significant electrolyte abnormality, anemia, or leukocytosis.  No hypoglycemia.  Chest x-ray without cardiopulmonary pathology.  CT head and C-spine were negative for ICH, fracture, traumatic malalignment.  Patient's workup was reassuring without evidence of cardiac arrhythmia or acute traumatic  injury.  Patient's presentation is most likely vasovagal versus orthostatic syncope in the setting of viral illness.  Patient was given Zofran  for nausea, Tylenol  for headache, Tdap was updated.  Patient's head laceration was repaired with staples, see procedure note above.  Patient is medically ready for discharge home with close outpatient follow-up and staple removal in 10 days.  Patient agreed this plan, strict return precautions were given, patient discharged home in stable condition.    Amount and/or Complexity of Data Reviewed External Data Reviewed: notes. Labs: ordered. Decision-making details documented in ED Course. Radiology: ordered and independent interpretation performed. Decision-making details documented in ED Course. ECG/medicine tests: ordered and independent interpretation performed. Decision-making details documented in ED Course.  Risk OTC drugs. Prescription drug management.       Final diagnoses:  Vasovagal syncope    ED Discharge Orders          Ordered    ondansetron  (ZOFRAN -ODT) 4 MG disintegrating tablet  Every 8 hours PRN        04/23/24 0911               Sharlet Dowdy, MD 04/23/24 1035    Tegeler, Lonni PARAS, MD 04/24/24 [phone]

## 2024-04-23 NOTE — ED Notes (Signed)
 CCMD called, pt on cardiac monitoring
